# Patient Record
Sex: Female | Born: 2005 | Race: Black or African American | Hispanic: No | Marital: Single | Smoking: Never smoker
Health system: Southern US, Community
[De-identification: ages and names within clinical notes are randomized; demographics above are authoritative.]

## PROBLEM LIST (undated history)

## (undated) DIAGNOSIS — Q390 Atresia of esophagus without fistula: Secondary | ICD-10-CM

## (undated) DIAGNOSIS — J45909 Unspecified asthma, uncomplicated: Secondary | ICD-10-CM

## (undated) DIAGNOSIS — K219 Gastro-esophageal reflux disease without esophagitis: Secondary | ICD-10-CM

## (undated) DIAGNOSIS — Q675 Congenital deformity of spine: Secondary | ICD-10-CM

## (undated) DIAGNOSIS — Z8774 Personal history of (corrected) congenital malformations of heart and circulatory system: Secondary | ICD-10-CM

## (undated) HISTORY — DX: Congenital deformity of spine: Q67.5

## (undated) HISTORY — PX: TRACHEOESOPHAGEAL FISTULA REPAIR: SHX2557

---
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
## 2023-01-14 DIAGNOSIS — Z419 Encounter for procedure for purposes other than remedying health state, unspecified: Secondary | ICD-10-CM | POA: Diagnosis not present

## 2023-01-17 DIAGNOSIS — F411 Generalized anxiety disorder: Secondary | ICD-10-CM | POA: Diagnosis not present

## 2023-01-17 DIAGNOSIS — Z8659 Personal history of other mental and behavioral disorders: Secondary | ICD-10-CM | POA: Diagnosis not present

## 2023-01-17 DIAGNOSIS — F4325 Adjustment disorder with mixed disturbance of emotions and conduct: Secondary | ICD-10-CM | POA: Diagnosis not present

## 2023-02-20 DIAGNOSIS — F4325 Adjustment disorder with mixed disturbance of emotions and conduct: Secondary | ICD-10-CM | POA: Diagnosis not present

## 2023-02-20 DIAGNOSIS — F411 Generalized anxiety disorder: Secondary | ICD-10-CM | POA: Diagnosis not present

## 2023-02-20 DIAGNOSIS — Z8659 Personal history of other mental and behavioral disorders: Secondary | ICD-10-CM | POA: Diagnosis not present

## 2023-03-26 IMAGING — CR DG CHEST 2V
2 series · 2 of 2 positions shown · non-contrast
Comparison: Chest radiograph 08/22/2020

CLINICAL DATA: Cough

EXAM:
CHEST - 2 VIEW

[w chest pa]
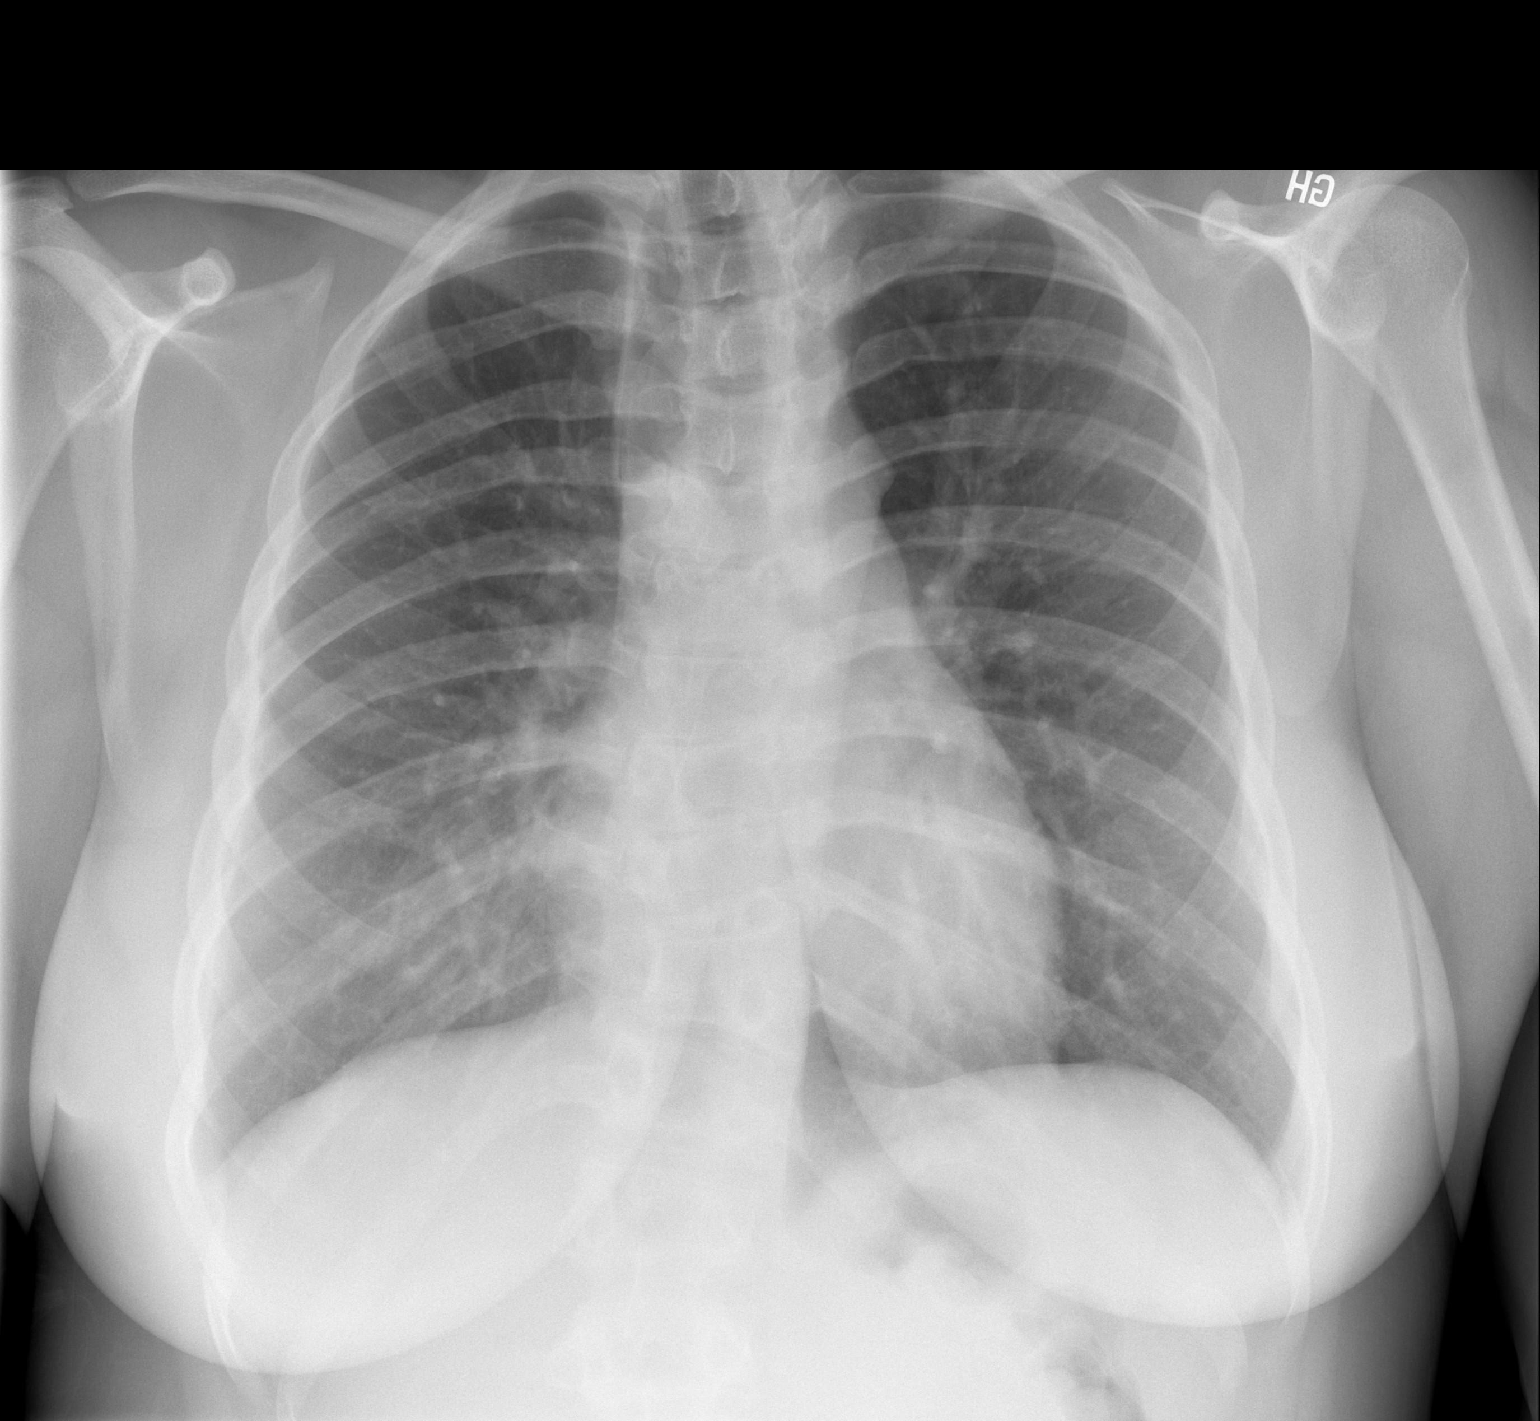

[w chest lat]
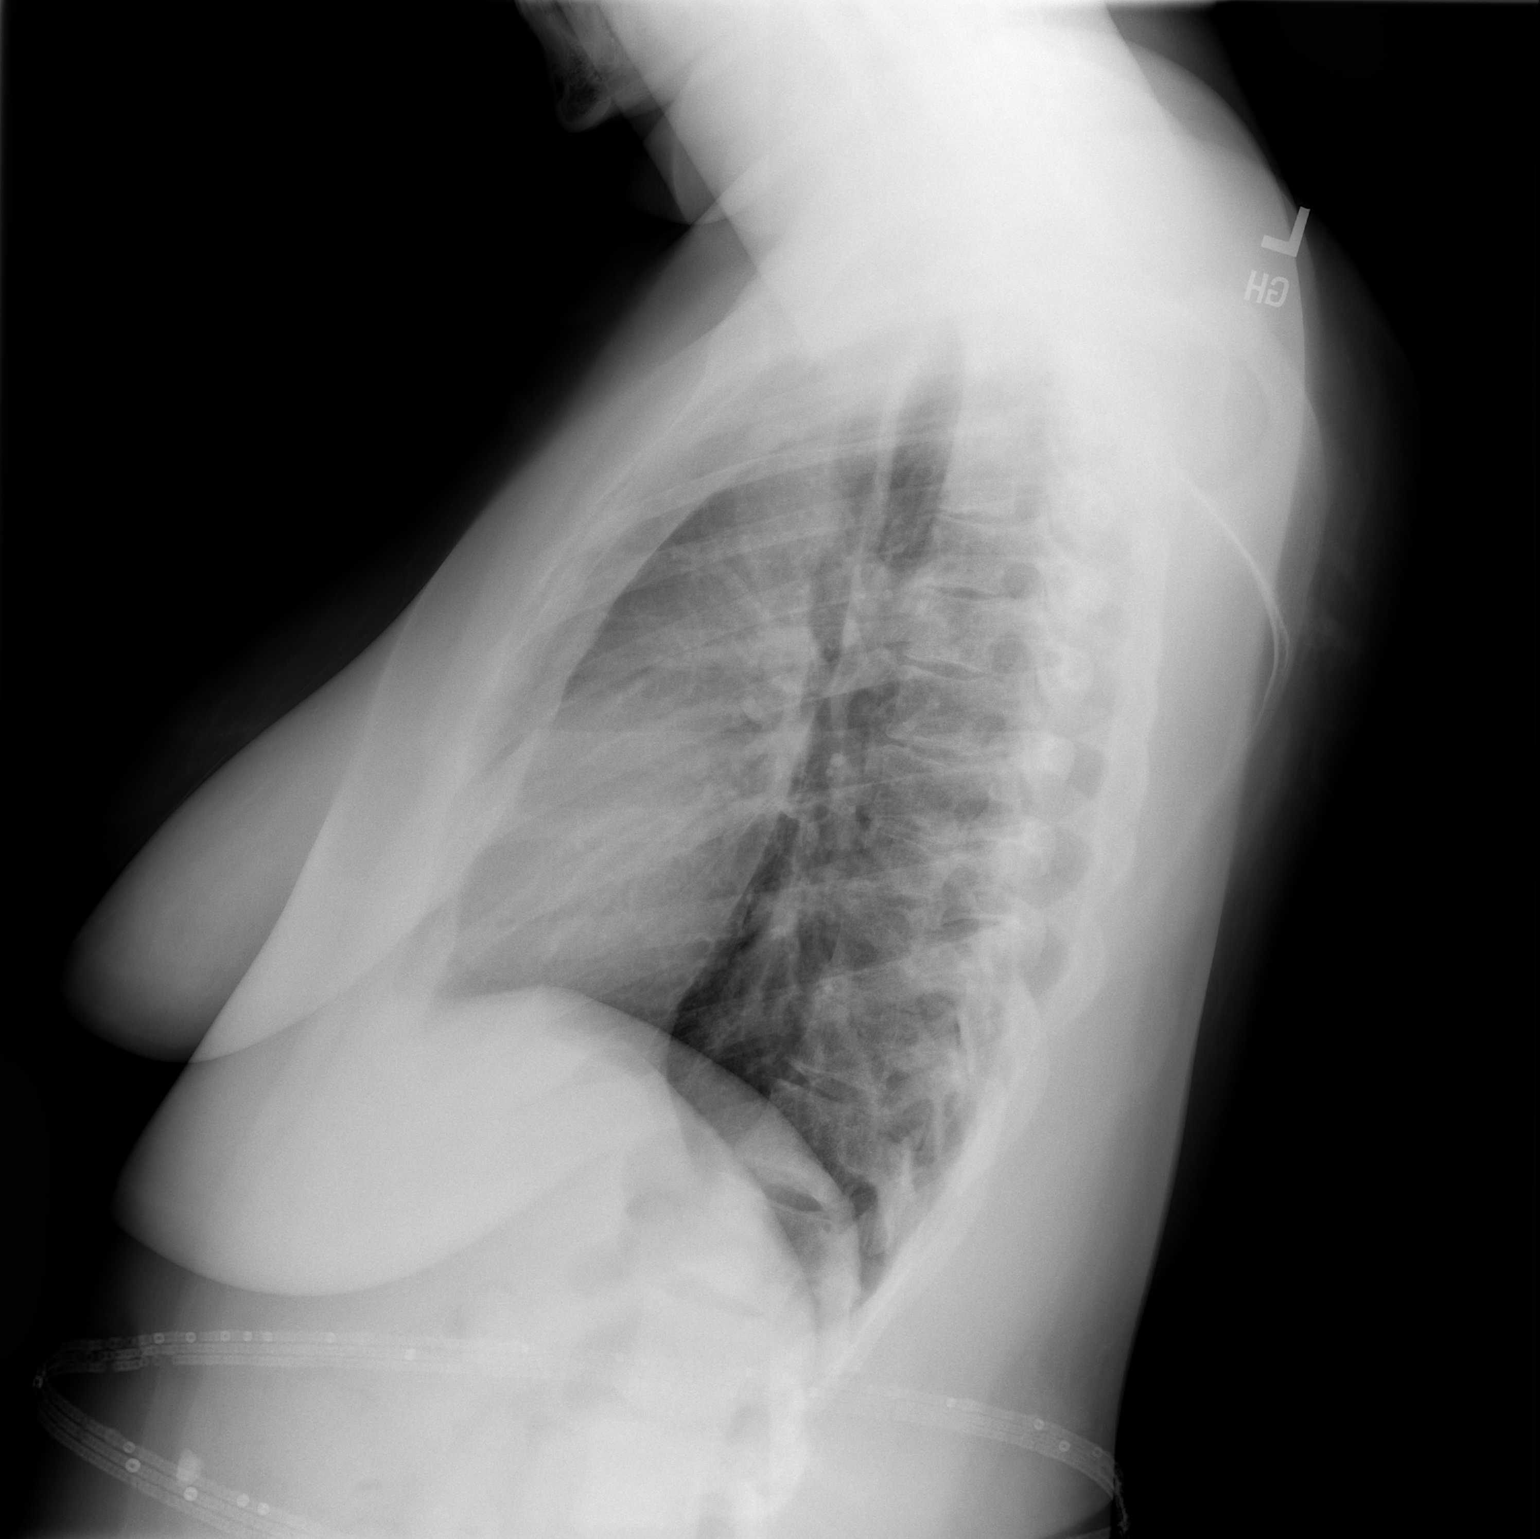

[2 of 2 positions shown; findings below may reference images not displayed]

FINDINGS: The cardiomediastinal silhouette is normal.

There is central peribronchial thickening with increased perihilar
opacities. There is no focal consolidation. There is no pulmonary
edema. There is no pleural effusion or pneumothorax.

A vertebral anomaly at T11 is again seen with associated mild
scoliosis. There is no acute osseous abnormality.
IMPRESSION: Central peribronchial thickening and perihilar opacities suggesting
viral bronchiolitis or reactive airway disease.

## 2023-05-21 ENCOUNTER — Emergency Department (HOSPITAL_COMMUNITY): Admit: 2023-05-21 | Payer: Medicaid Other | Source: Home / Self Care

## 2023-06-14 ENCOUNTER — Emergency Department (HOSPITAL_BASED_OUTPATIENT_CLINIC_OR_DEPARTMENT_OTHER)
Admission: EM | Admit: 2023-06-14 | Discharge: 2023-06-14 | Disposition: A | Discharge status: Home / Self Care | Payer: Medicaid Other | Attending: Emergency Medicine | Admitting: Emergency Medicine

## 2023-06-14 ENCOUNTER — Emergency Department (HOSPITAL_BASED_OUTPATIENT_CLINIC_OR_DEPARTMENT_OTHER): Payer: Medicaid Other

## 2023-06-14 ENCOUNTER — Encounter (HOSPITAL_BASED_OUTPATIENT_CLINIC_OR_DEPARTMENT_OTHER): Payer: Self-pay | Admitting: Emergency Medicine

## 2023-06-14 ENCOUNTER — Other Ambulatory Visit: Payer: Self-pay | Admitting: Family Medicine

## 2023-06-14 ENCOUNTER — Other Ambulatory Visit: Payer: Self-pay

## 2023-06-14 DIAGNOSIS — R197 Diarrhea, unspecified: Secondary | ICD-10-CM | POA: Insufficient documentation

## 2023-06-14 DIAGNOSIS — R110 Nausea: Secondary | ICD-10-CM | POA: Insufficient documentation

## 2023-06-14 DIAGNOSIS — J45909 Unspecified asthma, uncomplicated: Secondary | ICD-10-CM | POA: Insufficient documentation

## 2023-06-14 DIAGNOSIS — R1032 Left lower quadrant pain: Secondary | ICD-10-CM

## 2023-06-14 DIAGNOSIS — R1084 Generalized abdominal pain: Secondary | ICD-10-CM | POA: Diagnosis not present

## 2023-06-14 LAB — CBC WITH DIFFERENTIAL/PLATELET
Abs Immature Granulocytes: 0.01 10*3/uL (ref 0.00–0.07)
Basophils Absolute: 0 10*3/uL (ref 0.0–0.1)
Basophils Relative: 0 %
Eosinophils Absolute: 0.1 10*3/uL (ref 0.0–0.5)
Eosinophils Relative: 2 %
HCT: 38.3 % (ref 36.0–46.0)
Hemoglobin: 12.3 g/dL (ref 12.0–15.0)
Immature Granulocytes: 0 %
Lymphocytes Relative: 47 %
Lymphs Abs: 1.7 10*3/uL (ref 0.7–4.0)
MCH: 25.1 pg — ABNORMAL LOW (ref 26.0–34.0)
MCHC: 32.1 g/dL (ref 30.0–36.0)
MCV: 78.2 fL — ABNORMAL LOW (ref 80.0–100.0)
Monocytes Absolute: 0.3 10*3/uL (ref 0.1–1.0)
Monocytes Relative: 8 %
Neutro Abs: 1.6 10*3/uL — ABNORMAL LOW (ref 1.7–7.7)
Neutrophils Relative %: 43 %
Platelets: 318 10*3/uL (ref 150–400)
RBC: 4.9 MIL/uL (ref 3.87–5.11)
RDW: 14.5 % (ref 11.5–15.5)
WBC: 3.7 10*3/uL — ABNORMAL LOW (ref 4.0–10.5)
nRBC: 0 % (ref 0.0–0.2)

## 2023-06-14 LAB — COMPREHENSIVE METABOLIC PANEL
ALT: 10 U/L (ref 0–44)
AST: 15 U/L (ref 15–41)
Albumin: 4 g/dL (ref 3.5–5.0)
Alkaline Phosphatase: 37 U/L — ABNORMAL LOW (ref 38–126)
Anion gap: 5 (ref 5–15)
BUN: 10 mg/dL (ref 6–20)
CO2: 29 mmol/L (ref 22–32)
Calcium: 9.9 mg/dL (ref 8.9–10.3)
Chloride: 103 mmol/L (ref 98–111)
Creatinine, Ser: 0.79 mg/dL (ref 0.44–1.00)
GFR, Estimated: >60 mL/min (ref >60)
Glucose, Bld: 89 mg/dL (ref 70–99)
Potassium: 3.8 mmol/L (ref 3.5–5.1)
Sodium: 137 mmol/L (ref 135–145)
Total Bilirubin: 0.4 mg/dL (ref 0.3–1.2)
Total Protein: 7.4 g/dL (ref 6.5–8.1)

## 2023-06-14 LAB — URINALYSIS, ROUTINE W REFLEX MICROSCOPIC
Bilirubin Urine: NEGATIVE
Glucose, UA: NEGATIVE mg/dL
Hgb urine dipstick: NEGATIVE
Ketones, ur: NEGATIVE mg/dL
Leukocytes,Ua: NEGATIVE
Nitrite: NEGATIVE
Specific Gravity, Urine: 1.025 (ref 1.005–1.030)
pH: 6.5 (ref 5.0–8.0)

## 2023-06-14 LAB — LIPASE, BLOOD: Lipase: 12 U/L (ref 11–51)

## 2023-06-14 LAB — PREGNANCY, URINE: Preg Test, Ur: NEGATIVE

## 2023-06-14 MED ORDER — ONDANSETRON HCL 4 MG/2ML IJ SOLN: 4.0000 mg | Freq: Once | INTRAMUSCULAR | Status: DC | PRN

## 2023-06-14 MED ORDER — IOHEXOL 300 MG/ML  SOLN
100.0000 mL | Freq: Once | INTRAMUSCULAR | Status: AC | PRN
  Administered 2023-06-14: 85 mL via INTRAVENOUS

## 2023-06-14 MED ORDER — DICYCLOMINE HCL 20 MG PO TABS
20.0000 mg | ORAL_TABLET | Freq: Two times a day (BID) | ORAL | 0 refills | Status: AC
Start: 2023-06-14 — End: ?

## 2023-06-14 NOTE — Discharge Instructions (Signed)
Thank you for allowing [place] to be a part of your care today.  Your evaluated in the ED for abdominal pain, nausea, and diarrhea.  I suspect your symptoms are related to a virus that we will need to run its course.  Your workup today is overall reassuring and your CT scan was negative for any abnormal findings.  I have sent over a medication to the pharmacy called Bentyl.  This is an antispasmodic that will hopefully help with the cramping you experience in your abdomen.  I also encourage you to pick up your Zofran to take as needed for nausea.  I recommend slowly advancing your diet as tolerated.  You may want to stick to liquids or soft solids, such as bland food, until your symptoms begin to improve.  Return to the ED if you develop sudden worsening of your symptoms or if you have new concerns.

## 2023-06-14 NOTE — ED Triage Notes (Signed)
Abdo pain Started last Tuesday.LUQ Some nausea. Loss of appetite

## 2023-06-14 NOTE — ED Provider Notes (Signed)
[place] [facility]ERGENCY DEPARTMENT AT [facility] Provider Note   CSN: [id number] Arrival date & time: 06/14/23  1054     History  Chief Complaint  Patient presents with   Abdominal Pain    Susan Harmon is a 17 y.o. female with past medical history significant for asthma, esophageal atresia, reflux presents to the ED complaining of abdominal pain that began last Tuesday.  She reports that initially started in her lower abdomen but is now on the left side.  She has also had a loss of her appetite, nausea, and diarrhea.  Patient has been evaluated [facility] and was given Zofran, but she has not yet filled this.  She states that she was also tested for STIs which were all negative.  She describes the pain as a cramping.  Her LMP was 05/29/2023 and was lighter than her normal.  Denies vomiting, constipation, fever, urinary symptoms, vaginal discharge or bleeding, pelvic pain.       Home Medications Prior to Admission medications   Medication Sig Start Date End Date Taking? Authorizing Provider  dicyclomine (BENTYL) 20 MG tablet Take 1 tablet (20 mg total) by mouth 2 (two) times daily. 06/14/23  Yes Ailen Strauch R, PA-C  albuterol (PROVENTIL HFA;VENTOLIN HFA) 108 (90 BASE) MCG/ACT inhaler Inhale 1-2 puffs into the lungs every 6 (six) hours as needed for wheezing or shortness of breath.    [provider]  cetirizine (ZYRTEC) 10 MG tablet Take by mouth. 01/07/21   [provider]  fluticasone (FLONASE) 50 MCG/ACT nasal spray Place into both nostrils. 07/12/22   [provider]  ibuprofen (ADVIL,MOTRIN) 400 MG tablet Take 1 tablet (400 mg total) by mouth every 6 (six) hours as needed. 11/13/17   Antony Madura, PA-C  ibuprofen (ADVIL,MOTRIN) 600 MG tablet Take 1 tablet (600 mg total) by mouth every 6 (six) hours as needed for fever, headache or mild pain. 01/28/18   Sherrilee Gilles, NP  ipratropium (ATROVENT) 0.02 % nebulizer  solution Take 2.5 mLs (0.5 mg total) by nebulization every 6 (six) hours as needed for wheezing or shortness of breath. 07/07/22   Renne Crigler, PA-C  montelukast (SINGULAIR) 10 MG tablet Take 1 tablet by mouth daily. 05/17/21   [provider]  Pantoprazole Sodium (PROTONIX PO) Take by mouth.    [provider]  SYMBICORT 160-4.5 MCG/ACT inhaler Inhale 2 puffs into the lungs 2 (two) times daily. 07/19/22   [provider]      Allergies    Patient has no known allergies.    Review of Systems   Review of Systems  Constitutional:  Positive for appetite change (decreased). Negative for fever.  Gastrointestinal:  Positive for abdominal pain, diarrhea and nausea. Negative for constipation and vomiting.  Genitourinary:  Negative for dysuria, frequency, pelvic pain, urgency, vaginal bleeding and vaginal discharge.    Physical Exam Updated Vital Signs BP 114/70   Pulse 92   Temp 98.3 F (36.8 C) (Oral)   Resp 16   LMP 05/29/2023   SpO2 99%  Physical Exam Vitals and nursing note reviewed.  Constitutional:      General: She is not in acute distress.    Appearance: Normal appearance. She is not ill-appearing or diaphoretic.  Cardiovascular:     Rate and Rhythm: Normal rate and regular rhythm.  Pulmonary:     Effort: Pulmonary effort is normal.  Abdominal:     General: Abdomen is flat.  Palpations: Abdomen is soft.     Tenderness: There is abdominal tenderness in the left upper quadrant and left lower quadrant.  Skin:    General: Skin is warm and dry.     Capillary Refill: Capillary refill takes less than 2 seconds.  Neurological:     Mental Status: She is alert. Mental status is at baseline.  Psychiatric:        Mood and Affect: Mood normal.        Behavior: Behavior normal.     ED Results / Procedures / Treatments   Labs (all labs ordered are listed, but only abnormal results are displayed) Labs Reviewed  COMPREHENSIVE METABOLIC PANEL -  Abnormal; Notable for the following components:      Result Value   Alkaline Phosphatase 37 (*)    All other components within normal limits  URINALYSIS, ROUTINE W REFLEX MICROSCOPIC - Abnormal; Notable for the following components:   Protein, ur TRACE (*)    All other components within normal limits  CBC WITH DIFFERENTIAL/PLATELET - Abnormal; Notable for the following components:   WBC 3.7 (*)    MCV 78.2 (*)    MCH 25.1 (*)    Neutro Abs 1.6 (*)    All other components within normal limits  LIPASE, BLOOD  PREGNANCY, URINE    EKG None  Radiology CT ABDOMEN PELVIS W CONTRAST  Result Date: 06/14/2023 CLINICAL DATA:  Left upper quadrant pain for several days. Nausea. Anorexia. EXAM: CT ABDOMEN AND PELVIS WITH CONTRAST TECHNIQUE: Multidetector CT imaging of the abdomen and pelvis was performed using the standard protocol following bolus administration of intravenous contrast. RADIATION DOSE REDUCTION: This exam was performed according to the departmental dose-optimization program which includes automated exposure control, adjustment of the mA and/or kV according to patient size and/or use of iterative reconstruction technique. CONTRAST:  85mL OMNIPAQUE IOHEXOL 300 MG/ML  SOLN COMPARISON:  None Available. FINDINGS: Lower Chest: No acute findings. Hepatobiliary: No suspicious hepatic masses identified. Gallbladder is unremarkable. No evidence of biliary ductal dilatation. Pancreas:  No mass or inflammatory changes. Spleen: Within normal limits in size and appearance. Adrenals/Urinary Tract: No suspicious masses identified. No evidence of ureteral calculi or hydronephrosis. Stomach/Bowel: No evidence of obstruction, inflammatory process or abnormal fluid collections. Normal appendix visualized. Vascular/Lymphatic: No pathologically enlarged lymph nodes. No acute vascular findings. Reproductive:  No mass or other significant abnormality. Other:  None. Musculoskeletal:  No suspicious bone lesions  identified. IMPRESSION: Negative.  No acute findings or other significant abnormality. Electronically Signed   By: Danae Orleans M.D.   On: 06/14/2023 14:48    Procedures Procedures    Medications Ordered in ED Medications  ondansetron (ZOFRAN) injection 4 mg (has no administration in time range)  iohexol (OMNIPAQUE) 300 MG/ML solution 100 mL (85 mLs Intravenous Contrast Given 06/14/23 1222)    ED Course/ Medical Decision Making/ A&P                                 Medical Decision Making Amount and/or Complexity of Data Reviewed Labs: ordered. Radiology: ordered.  Risk Prescription drug management.   This patient presents to the ED with chief complaint(s) of abdominal pain, nausea, diarrhea with noncontributory past medical history.  The complaint involves an extensive differential diagnosis and also carries with it a high risk of complications and morbidity.    The differential diagnosis includes viral syndrome, gastroenteritis, colitis, diverticulitis, UTI  The initial plan  is to obtain labs, UA, CT  Initial Assessment:   Exam significant for well-appearing patient who is not in acute distress.  Abdomen is soft and mildly tender to the left side.  No RUQ or RLQ tenderness.  No point tenderness.  No abdominal distention, overlying skin changes, or palpable hernias.  Vital signs are stable.  Patient is afebrile.  She is not actively vomiting.  Independent ECG/labs interpretation:  The following labs were independently interpreted:  CBC with mild leukocytosis, no anemia.  Metabolic panel without major electrolyte disturbance.  Hepatic and renal function are relatively normal.  UA without evidence of infection.  Pregnancy negative.  Lipase also not indicative of pancreatitis.  Independent visualization and interpretation of imaging: I independently visualized the following imaging with scope of interpretation limited to determining acute life threatening conditions related to  emergency care: CT abdomen pelvis, which revealed no acute intra-abdominal pathology to explain patient's symptoms.  Treatment and Reassessment: Patient was able to tolerate drinking without need of antiemetics.  Disposition:   Patient has prescription for Zofran prescribed by [facility] at [facility].  Will send her home on Bentyl to help with abdominal pain and cramping.  Suspect patient's symptoms are related to a viral syndrome.  Her workup today is overall reassuring.  The patient has been appropriately medically screened and/or stabilized in the ED. I have low suspicion for any other emergent medical condition which would require further screening, evaluation or treatment in the ED or require inpatient management. At time of discharge the patient is hemodynamically stable and in no acute distress. I have discussed work-up results and diagnosis with patient and answered all questions. Patient is agreeable with discharge plan. We discussed strict return precautions for returning to the emergency department and they verbalized understanding.              Final Clinical Impression(s) / ED Diagnoses Final diagnoses:  Generalized abdominal pain  Nausea without vomiting  Diarrhea, unspecified type    Rx / DC Orders ED Discharge Orders          Ordered    dicyclomine (BENTYL) 20 MG tablet  2 times daily        06/14/23 [address] PA-C 06/14/23 1508    Gwyneth Sprout, MD 06/16/23 [phone]

## 2023-06-15 ENCOUNTER — Ambulatory Visit
Admission: RE | Admit: 2023-06-15 | Discharge: 2023-06-15 | Disposition: A | Discharge status: Home / Self Care | Payer: Medicaid Other | Source: Ambulatory Visit | Attending: Family Medicine | Admitting: Family Medicine

## 2023-06-15 DIAGNOSIS — R1032 Left lower quadrant pain: Secondary | ICD-10-CM

## 2023-06-16 ENCOUNTER — Telehealth: Payer: Self-pay
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
## 2023-10-04 IMAGING — CR DG CHEST 2V
2 series · 2 of 2 positions shown · non-contrast
Comparison: July 04, 2021.

CLINICAL DATA: Fever, cough.

EXAM:
CHEST - 2 VIEW

[chest lat]
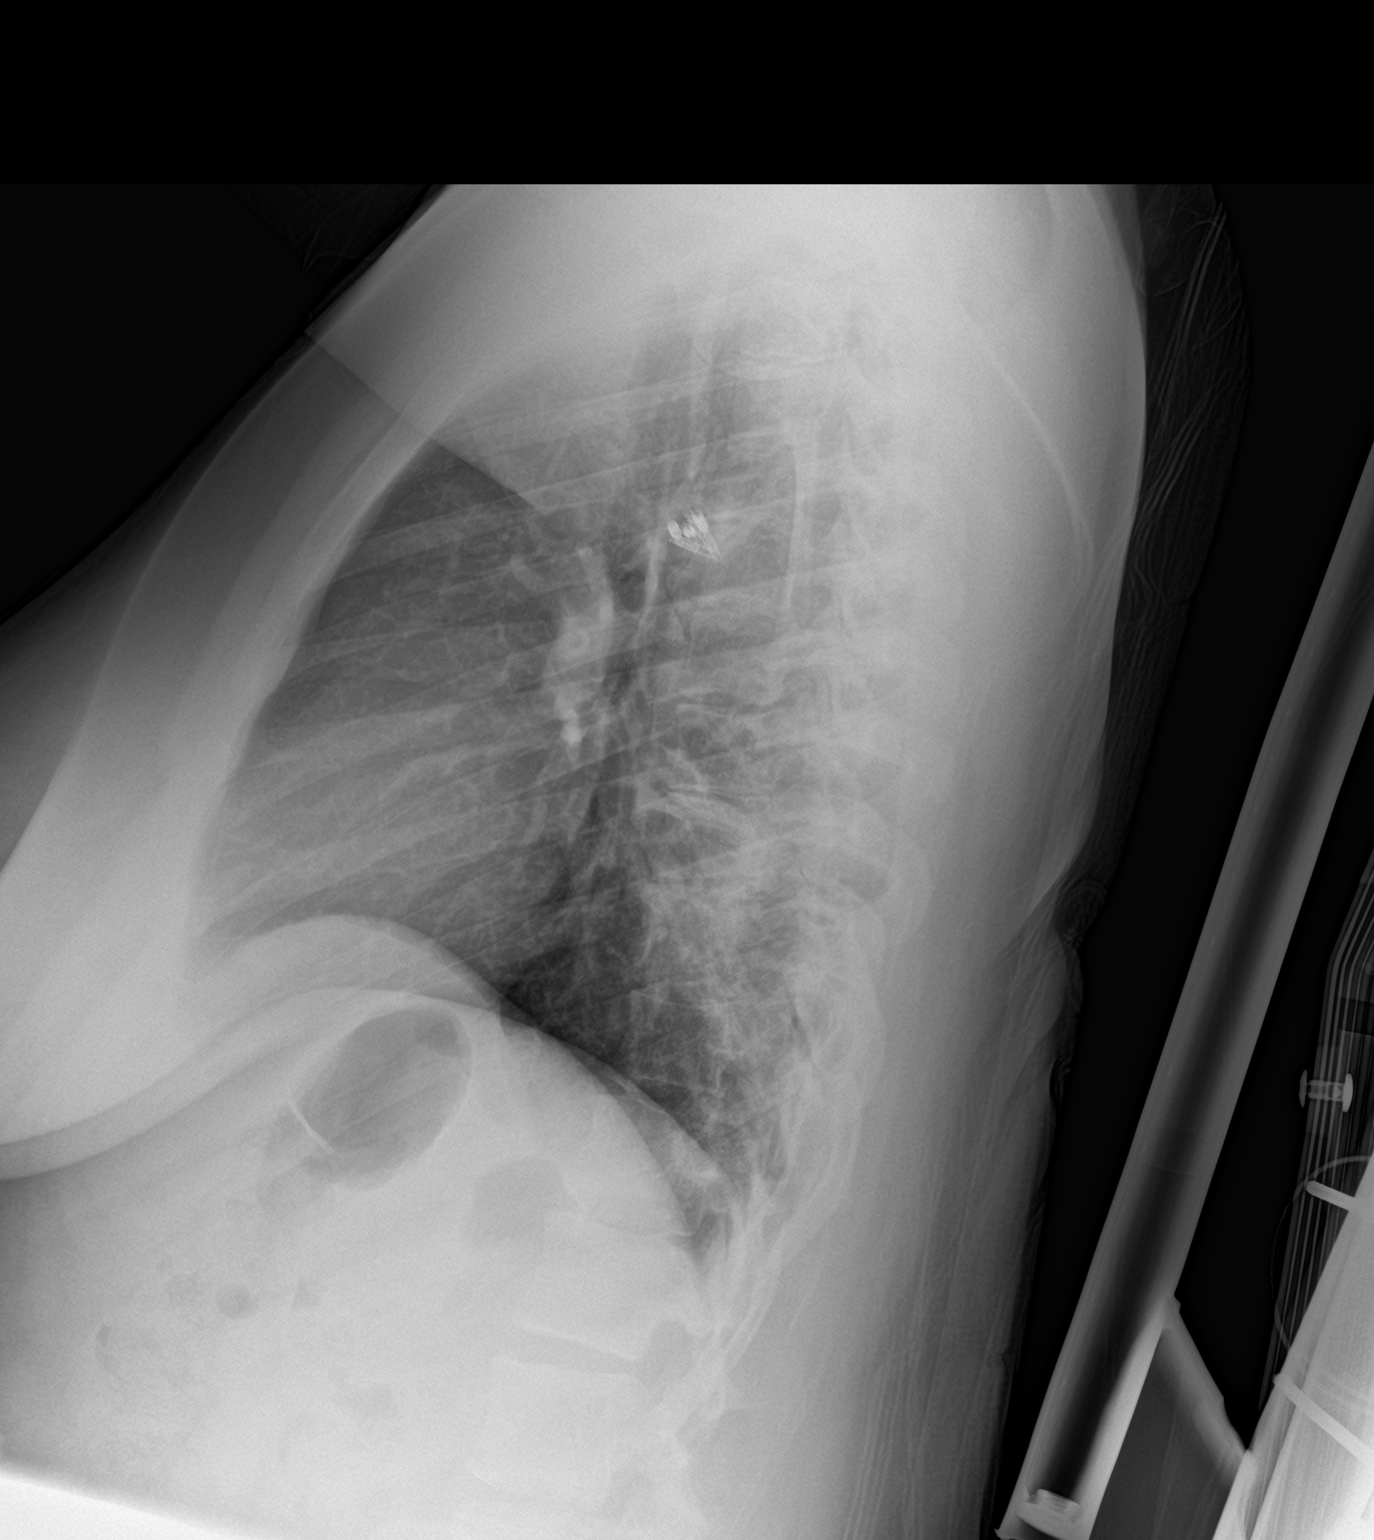

[chest ap]
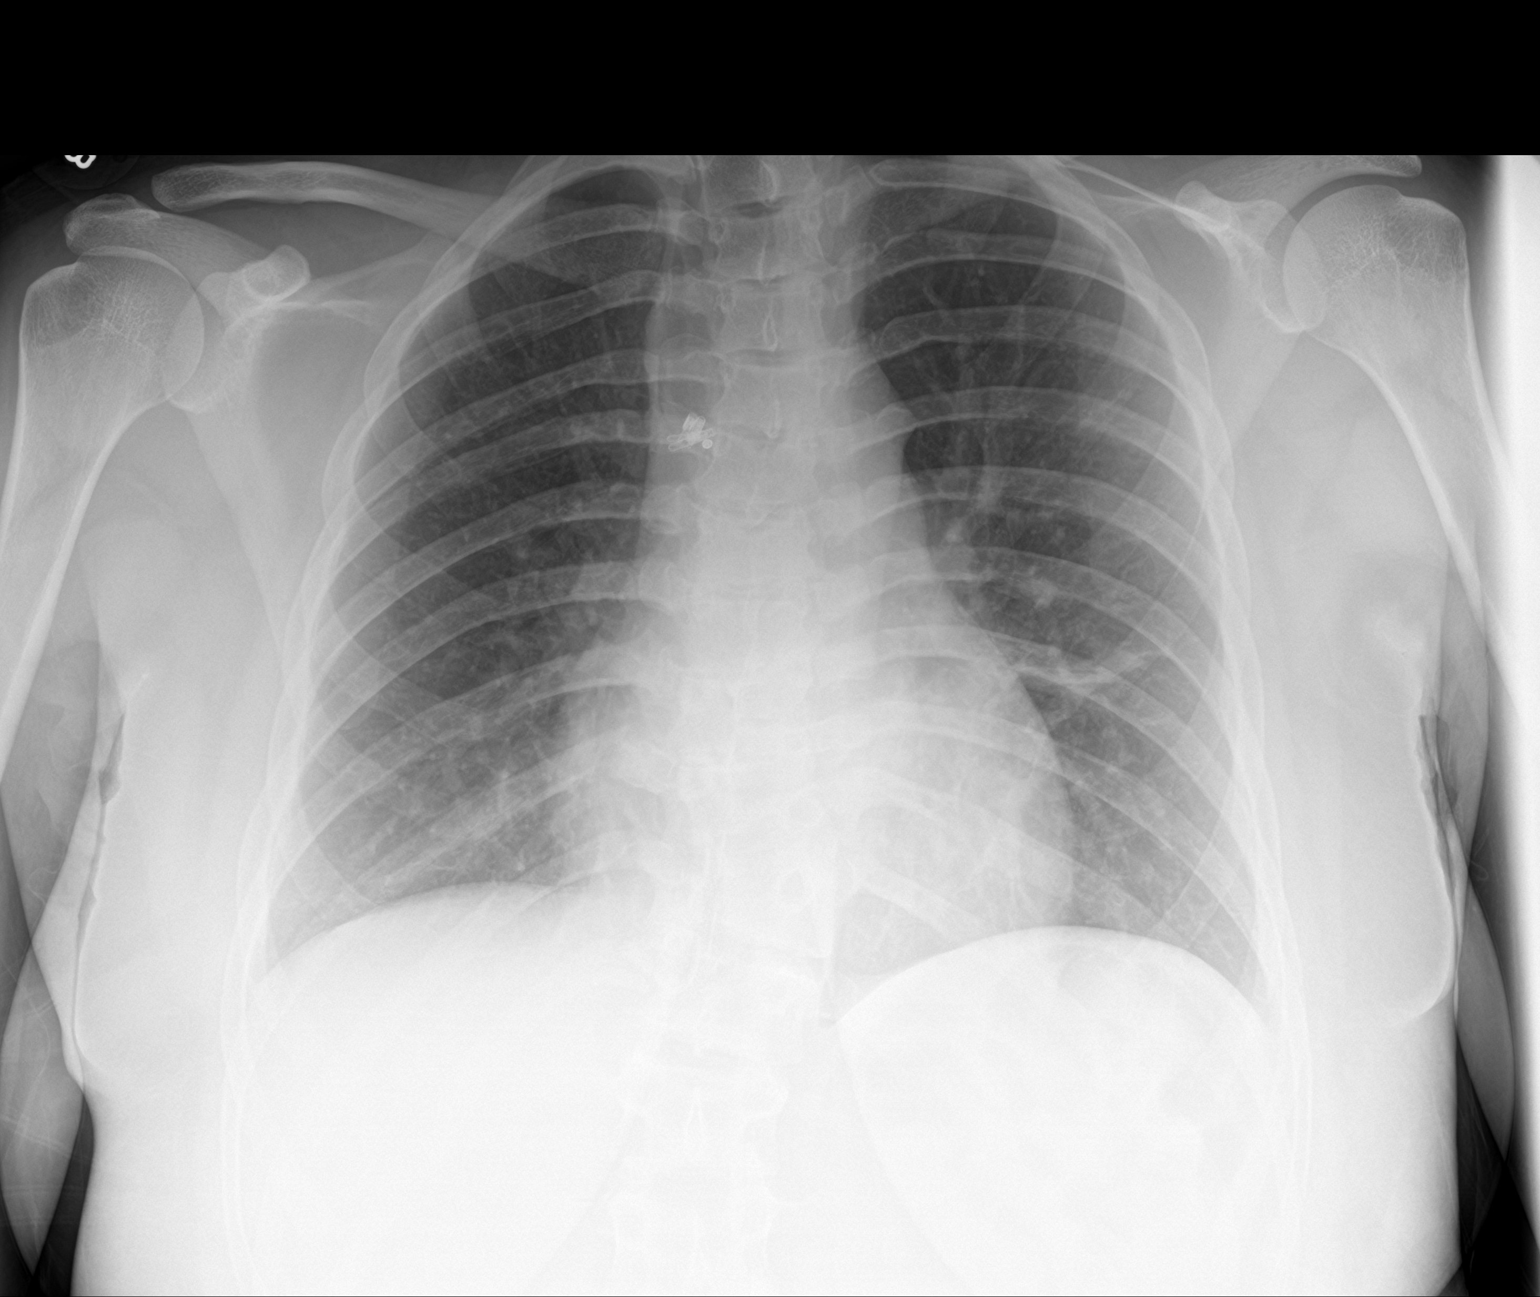

[2 of 2 positions shown; findings below may reference images not displayed]

FINDINGS: The heart size and mediastinal contours are within normal limits.
Right lung is clear. Left lingular subsegmental atelectasis is
noted. The visualized skeletal structures are unremarkable.
IMPRESSION: Mild left lingular subsegmental atelectasis.

## 2023-10-10 ENCOUNTER — Encounter (HOSPITAL_BASED_OUTPATIENT_CLINIC_OR_DEPARTMENT_OTHER): Payer: Self-pay | Admitting: Emergency Medicine

## 2023-10-10 ENCOUNTER — Emergency Department (HOSPITAL_BASED_OUTPATIENT_CLINIC_OR_DEPARTMENT_OTHER)
Admission: EM | Admit: 2023-10-10 | Discharge: 2023-10-10 | Disposition: A | Discharge status: Home / Self Care | Payer: No Typology Code available for payment source | Attending: Emergency Medicine | Admitting: Emergency Medicine

## 2023-10-10 DIAGNOSIS — M25562 Pain in left knee: Secondary | ICD-10-CM | POA: Insufficient documentation

## 2023-10-10 DIAGNOSIS — M545 Low back pain, unspecified: Secondary | ICD-10-CM | POA: Diagnosis present

## 2023-10-10 DIAGNOSIS — Y92410 Unspecified street and highway as the place of occurrence of the external cause: Secondary | ICD-10-CM | POA: Insufficient documentation

## 2023-10-10 DIAGNOSIS — Z79899 Other long term (current) drug therapy: Secondary | ICD-10-CM | POA: Diagnosis not present

## 2023-10-10 MED ORDER — METHOCARBAMOL 500 MG PO TABS
500.0000 mg | ORAL_TABLET | Freq: Two times a day (BID) | ORAL | 0 refills | Status: AC
Start: 2023-10-10 — End: ?
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
## 2023-10-10 NOTE — ED Notes (Signed)
 Reviewed AVS/discharge instruction with patient. Time allotted for and all questions answered. Patient is agreeable for d/c and escorted to ed exit by staff.

## 2023-12-09 ENCOUNTER — Emergency Department (HOSPITAL_BASED_OUTPATIENT_CLINIC_OR_DEPARTMENT_OTHER)

## 2023-12-09 ENCOUNTER — Other Ambulatory Visit: Payer: Self-pay

## 2023-12-09 ENCOUNTER — Emergency Department (HOSPITAL_BASED_OUTPATIENT_CLINIC_OR_DEPARTMENT_OTHER)
Admission: EM | Admit: 2023-12-09 | Discharge: 2023-12-09 | Disposition: A | Discharge status: Home / Self Care | Attending: Emergency Medicine | Admitting: Emergency Medicine

## 2023-12-09 ENCOUNTER — Encounter (HOSPITAL_BASED_OUTPATIENT_CLINIC_OR_DEPARTMENT_OTHER): Payer: Self-pay | Admitting: Emergency Medicine

## 2023-12-09 DIAGNOSIS — R059 Cough, unspecified: Secondary | ICD-10-CM | POA: Diagnosis present

## 2023-12-09 DIAGNOSIS — Z7952 Long term (current) use of systemic steroids: Secondary | ICD-10-CM | POA: Diagnosis not present

## 2023-12-09 DIAGNOSIS — J4541 Moderate persistent asthma with (acute) exacerbation: Secondary | ICD-10-CM | POA: Diagnosis not present

## 2023-12-09 DIAGNOSIS — Z7951 Long term (current) use of inhaled steroids: Secondary | ICD-10-CM | POA: Insufficient documentation

## 2023-12-09 LAB — RESP PANEL BY RT-PCR (RSV, FLU A&B, COVID)  RVPGX2
Influenza A by PCR: NEGATIVE
Influenza B by PCR: NEGATIVE
Resp Syncytial Virus by PCR: NEGATIVE
SARS Coronavirus 2 by RT PCR: NEGATIVE

## 2023-12-09 MED ORDER — ALBUTEROL SULFATE HFA 108 (90 BASE) MCG/ACT IN AERS
2.0000 | INHALATION_SPRAY | RESPIRATORY_TRACT | Status: DC | PRN
Start: 2023-12-09 — End: 2023-12-09

## 2023-12-09 MED ORDER — PREDNISONE 20 MG PO TABS: 40.0000 mg | ORAL_TABLET | Freq: Every day | ORAL | 0 refills | Status: AC

## 2023-12-09 MED ORDER — PREDNISONE 50 MG PO TABS
60.0000 mg | ORAL_TABLET | Freq: Once | ORAL | Status: AC
  Administered 2023-12-09: 60 mg via ORAL
  Filled 2023-12-09: qty 1

## 2023-12-09 MED ORDER — ALBUTEROL SULFATE (2.5 MG/3ML) 0.083% IN NEBU
2.5000 mg | INHALATION_SOLUTION | Freq: Once | RESPIRATORY_TRACT | Status: AC
  Administered 2023-12-09: 2.5 mg via RESPIRATORY_TRACT
  Filled 2023-12-09: qty 3
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
## 2023-12-09 NOTE — ED Notes (Signed)
 RT Note: Patient has HX Asthma. Breath sounds are slight expiratory wheezes with a tight chest feeling. A breathing treatment is ordered

## 2023-12-09 NOTE — ED Notes (Signed)
 RT note: Patient was asked if she needed an Albuterol  inhaler before she left, she stated she is good and doesn't need one at this time

## 2023-12-09 NOTE — ED Provider Notes (Signed)
 [place] EMERGENCY DEPARTMENT AT [facility] Provider Note   CSN: [id number] Arrival date & time: 12/09/23  0707     History  Chief Complaint  Patient presents with   asthma exacerbation    Susan Harmon is a 18 y.o. female.  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  She reports over the last few days she is felt cold with nonproductive cough but this morning is when she woke up with the shortness of breath.  She has had nasal congestion and a bit of a sore throat.  She has had multiple sick contacts that she lives in a college dorm but also reports that this morning she realized her air conditioning unit had significant mold.  She has tried to use her inhaler at home today but it did not make her feel better.  The history is provided by the patient.       Home Medications Prior to Admission medications   Medication Sig Start Date End Date Taking? Authorizing Provider  predniSONE  (DELTASONE ) 20 MG tablet Take 2 tablets (40 mg total) by mouth daily. 12/09/23  Yes Almond Army, MD  albuterol  (PROVENTIL  HFA;VENTOLIN  HFA) 108 (90 BASE) MCG/ACT inhaler Inhale 1-2 puffs into the lungs every 6 (six) hours as needed for wheezing or shortness of breath.    [provider]  cetirizine (ZYRTEC) 10 MG tablet Take by mouth. 01/07/21   [provider]  dicyclomine  (BENTYL ) 20 MG tablet Take 1 tablet (20 mg total) by mouth 2 (two) times daily. 06/14/23   Clark, Meghan R, PA-C  fluticasone (FLONASE) 50 MCG/ACT nasal spray Place into both nostrils. 07/12/22   [provider]  ibuprofen  (ADVIL ,MOTRIN ) 400 MG tablet Take 1 tablet (400 mg total) by mouth every 6 (six) hours as needed. 11/13/17   Carleton Cheek, PA-C  ibuprofen  (ADVIL ,MOTRIN ) 600 MG tablet Take 1  tablet (600 mg total) by mouth every 6 (six) hours as needed for fever, headache or mild pain. 01/28/18   Jannine Meo, NP  ipratropium (ATROVENT ) 0.02 % nebulizer solution Take 2.5 mLs (0.5 mg total) by nebulization every 6 (six) hours as needed for wheezing or shortness of breath. 07/07/22   Geiple, Joshua, PA-C  methocarbamol  (ROBAXIN ) 500 MG tablet Take 1 tablet (500 mg total) by mouth 2 (two) times daily. 10/10/23   Felicie Horning, PA-C  montelukast (SINGULAIR) 10 MG tablet Take 1 tablet by mouth daily. 05/17/21   [provider]  Pantoprazole Sodium (PROTONIX PO) Take by mouth.    [provider]  SYMBICORT 160-4.5 MCG/ACT inhaler Inhale 2 puffs into the lungs 2 (two) times daily. 07/19/22   [provider]      Allergies    Patient has no known allergies.    Review of Systems   Review of Systems  Physical Exam Updated Vital Signs BP 104/78   Pulse 75   Temp 98 F (36.7 C) (Oral)   Resp 17   Wt 89 kg   SpO2 99%  Physical Exam Vitals and nursing note reviewed.  Constitutional:      General: She is not in acute distress.    Appearance: She is well-developed.  HENT:     Head: Normocephalic and atraumatic.     Nose: Congestion present.     Right Turbinates: Swollen.  Left Turbinates: Swollen.     Mouth/Throat:     Mouth: Mucous membranes are moist.     Pharynx: Posterior oropharyngeal erythema present. No oropharyngeal exudate.  Eyes:     Pupils: Pupils are equal, round, and reactive to light.  Cardiovascular:     Rate and Rhythm: Normal rate and regular rhythm.     Heart sounds: Normal heart sounds. No murmur heard.    No friction rub.  Pulmonary:     Effort: Pulmonary effort is normal.     Breath sounds: Normal breath sounds. No wheezing or rales.     Comments: Wheezing per RT however upon my arrival patient had completed a DuoNeb and now has no wheezing Musculoskeletal:        General: No tenderness. Normal range of motion.      Comments: No edema  Skin:    General: Skin is warm and dry.     Findings: No rash.  Neurological:     Mental Status: She is alert and oriented to person, place, and time. Mental status is at baseline.  Psychiatric:        Behavior: Behavior normal.     ED Results / Procedures / Treatments   Labs (all labs ordered are listed, but only abnormal results are displayed) Labs Reviewed  RESP PANEL BY RT-PCR (RSV, FLU A&B, COVID)  [id number]    EKG None  Radiology DG Chest Port 1 View Result Date: 12/09/2023 CLINICAL DATA:  Cough and shortness of breath. EXAM: PORTABLE CHEST 1 VIEW COMPARISON:  07/07/2022 FINDINGS: Normal heart size and stable mediastinal contours, including upper esophageal gas distension. No acute infiltrate or edema. No effusion or pneumothorax. No acute osseous findings. Closely position fifth and sixth ribs on the right, likely from prior thoracotomy in this patient with history of TE fistula repair IMPRESSION: Stable postoperative chest.  No acute finding Electronically Signed   By: Ronnette Coke M.D.   On: 12/09/2023 08:03    Procedures Procedures    Medications Ordered in ED Medications  albuterol  (VENTOLIN  HFA) 108 (90 Base) MCG/ACT inhaler 2 puff (has no administration in time range)  albuterol  (PROVENTIL ) (2.5 MG/3ML) 0.083% nebulizer solution 2.5 mg (2.5 mg Nebulization Given 12/09/23 0725)  predniSONE  (DELTASONE ) tablet 60 mg (60 mg Oral Given 12/09/23 0749)    ED Course/ Medical Decision Making/ A&P                                 Medical Decision Making Amount and/or Complexity of Data Reviewed Radiology: ordered and independent interpretation performed. Decision-making details documented in ED Course.  Risk Prescription drug management.   Pt with multiple medical problems and comorbidities and presenting today with a complaint that caries a high risk for morbidity and mortality.  Here today with URI symptoms and back causing exacerbation.  Also  patient exposed to mold through her air conditioning unit.  After 1 DuoNeb wheezing has resolved.  Oxygen saturation is 100%.  Will swab for COVID as well as patient given a dose of prednisone .  I have independently visualized and interpreted pt's images today.  Chest x-ray without acute findings today.  Viral swabs are negative.  On repeat evaluation patient wheezing has still resolved.  She is satting 99% on room air and at this time appears stable for discharge home.  She will be given a short course of prednisone .  Final Clinical Impression(s) / ED Diagnoses Final diagnoses:  Moderate persistent asthma with exacerbation    Rx / DC Orders ED Discharge Orders          Ordered    predniSONE  (DELTASONE ) 20 MG tablet  Daily        12/09/23 0851              Almond Army, MD 12/09/23 [phone]

## 2023-12-09 NOTE — Discharge Instructions (Addendum)
 The x-ray was normal today and the COVID test was negative.  Try to avoid the dorm room until they are able to fix the mold issue.

## 2023-12-22 ENCOUNTER — Emergency Department (HOSPITAL_BASED_OUTPATIENT_CLINIC_OR_DEPARTMENT_OTHER)
Admission: EM | Admit: 2023-12-22 | Discharge: 2023-12-22 | Disposition: A | Discharge status: Home / Self Care | Attending: Emergency Medicine | Admitting: Emergency Medicine

## 2023-12-22 ENCOUNTER — Encounter (HOSPITAL_BASED_OUTPATIENT_CLINIC_OR_DEPARTMENT_OTHER): Payer: Self-pay | Admitting: Emergency Medicine

## 2023-12-22 ENCOUNTER — Other Ambulatory Visit: Payer: Self-pay

## 2023-12-22 DIAGNOSIS — N939 Abnormal uterine and vaginal bleeding, unspecified: Secondary | ICD-10-CM | POA: Insufficient documentation

## 2023-12-22 DIAGNOSIS — R1032 Left lower quadrant pain: Secondary | ICD-10-CM | POA: Diagnosis present

## 2023-12-22 LAB — WET PREP, GENITAL
Clue Cells Wet Prep HPF POC: NONE SEEN
Sperm: NONE SEEN
Trich, Wet Prep: NONE SEEN
WBC, Wet Prep HPF POC: <10 (ref <10)
Yeast Wet Prep HPF POC: NONE SEEN

## 2023-12-22 LAB — URINALYSIS, ROUTINE W REFLEX MICROSCOPIC
Bilirubin Urine: NEGATIVE
Glucose, UA: NEGATIVE mg/dL
Ketones, ur: NEGATIVE mg/dL
Nitrite: NEGATIVE
Protein, ur: NEGATIVE mg/dL
RBC / HPF: >50 RBC/hpf (ref 0–5)
Specific Gravity, Urine: 1.007 (ref 1.005–1.030)
pH: 7 (ref 5.0–8.0)

## 2023-12-22 LAB — CBC
HCT: 38.3 % (ref 36.0–46.0)
Hemoglobin: 12.4 g/dL (ref 12.0–15.0)
MCH: 25.1 pg — ABNORMAL LOW (ref 26.0–34.0)
MCHC: 32.4 g/dL (ref 30.0–36.0)
MCV: 77.5 fL — ABNORMAL LOW (ref 80.0–100.0)
Platelets: 305 10*3/uL (ref 150–400)
RBC: 4.94 MIL/uL (ref 3.87–5.11)
RDW: 14.4 % (ref 11.5–15.5)
WBC: 5.1 10*3/uL (ref 4.0–10.5)
nRBC: 0 % (ref 0.0–0.2)

## 2023-12-22 LAB — COMPREHENSIVE METABOLIC PANEL WITH GFR
ALT: 11 U/L (ref 0–44)
AST: 15 U/L (ref 15–41)
Albumin: 4.1 g/dL (ref 3.5–5.0)
Alkaline Phosphatase: 51 U/L (ref 38–126)
Anion gap: 9 (ref 5–15)
BUN: 7 mg/dL (ref 6–20)
CO2: 27 mmol/L (ref 22–32)
Calcium: 9.5 mg/dL (ref 8.9–10.3)
Chloride: 99 mmol/L (ref 98–111)
Creatinine, Ser: 0.71 mg/dL (ref 0.44–1.00)
GFR, Estimated: >60 mL/min (ref >60)
Glucose, Bld: 113 mg/dL — ABNORMAL HIGH (ref 70–99)
Potassium: 4.2 mmol/L (ref 3.5–5.1)
Sodium: 135 mmol/L (ref 135–145)
Total Bilirubin: <0.2 mg/dL (ref 0.0–1.2)
Total Protein: 7.3 g/dL (ref 6.5–8.1)

## 2023-12-22 LAB — LIPASE, BLOOD: Lipase: 15 U/L (ref 11–51)

## 2023-12-22 LAB — PREGNANCY, URINE: Preg Test, Ur: NEGATIVE

## 2023-12-22 MED ORDER — MEDROXYPROGESTERONE ACETATE 5 MG PO TABS
10.0000 mg | ORAL_TABLET | Freq: Three times a day (TID) | ORAL | 0 refills | Status: AC
Start: 2023-12-22 — End: 2023-12-27

## 2023-12-22 NOTE — ED Notes (Signed)
 Patient unable to provide urine sample at this time. Specimen cup given to patient and reviewed instructions for clean catch collection.

## 2023-12-22 NOTE — ED Triage Notes (Signed)
 LLQ pain, vaginal bleeding Started today around 2:45pm  Seen at [facility]  UA and xray done sent for eval, suggested US 

## 2023-12-22 NOTE — Discharge Instructions (Signed)
 Your labs here were reassuring today.  Your blood counts were normal and did not show any signs of infection.  Your kidney, liver, and pancreas labs were normal.  Your pregnancy test was negative.  Your urine did not show any signs of infection.  You have been prescribed Provera which should help stop your menstrual bleeding.  Please follow-up with your gynecologist as soon as possible for further management of your irregular cycles.  Return to the ER for any severe worsening of your abdominal pain, fevers, vomiting, any other new or concerning symptoms.

## 2023-12-22 NOTE — ED Provider Notes (Signed)
  EMERGENCY DEPARTMENT AT [facility] Provider Note   CSN: [id number] Arrival date & time: 12/22/23  1657     History  Chief Complaint  Patient presents with   Abdominal Pain    Susan Harmon is a 18 y.o. female with a significant past medical history presents with concern for left lower quadrant pain that started earlier today.  States pain is like a strain that occurs only with movement.  No pain at rest.  Denies any fevers, chills, nausea, vomiting, diarrhea.  Denies any abnormal vaginal discharge, dysuria, hematuria, increased frequency.  Reports onset of vaginal bleeding along with the pain.  She just got off of her menstrual cycle and is not supposed to be on her period.  States that she has had some irregular periods in the past and is being followed by her gynecologist for this. She is sexually active but denies concern for STIs.   Abdominal Pain      Home Medications Prior to Admission medications   Medication Sig Start Date End Date Taking? Authorizing Provider  medroxyPROGESTERone (PROVERA) 5 MG tablet Take 2 tablets (10 mg total) by mouth 3 (three) times daily for 5 days. 12/22/23 12/27/23 Yes Rexie Catena, PA-C  albuterol  (PROVENTIL  HFA;VENTOLIN  HFA) 108 (90 BASE) MCG/ACT inhaler Inhale 1-2 puffs into the lungs every 6 (six) hours as needed for wheezing or shortness of breath.    [provider]  cetirizine (ZYRTEC) 10 MG tablet Take by mouth. 01/07/21   [provider]  dicyclomine  (BENTYL ) 20 MG tablet Take 1 tablet (20 mg total) by mouth 2 (two) times daily. 06/14/23   Clark, Meghan R, PA-C  fluticasone (FLONASE) 50 MCG/ACT nasal spray Place into both nostrils. 07/12/22   [provider]  ibuprofen  (ADVIL ,MOTRIN ) 400 MG tablet Take 1 tablet (400 mg total) by mouth every 6 (six) hours as needed. 11/13/17   Carleton Cheek, PA-C  ibuprofen  (ADVIL ,MOTRIN ) 600 MG tablet Take 1 tablet (600 mg total) by mouth every 6 (six) hours  as needed for fever, headache or mild pain. 01/28/18   Jannine Meo, NP  ipratropium (ATROVENT ) 0.02 % nebulizer solution Take 2.5 mLs (0.5 mg total) by nebulization every 6 (six) hours as needed for wheezing or shortness of breath. 07/07/22   Geiple, Joshua, PA-C  methocarbamol  (ROBAXIN ) 500 MG tablet Take 1 tablet (500 mg total) by mouth 2 (two) times daily. 10/10/23   Felicie Horning, PA-C  montelukast (SINGULAIR) 10 MG tablet Take 1 tablet by mouth daily. 05/17/21   [provider]  Pantoprazole Sodium (PROTONIX PO) Take by mouth.    [provider]  predniSONE  (DELTASONE ) 20 MG tablet Take 2 tablets (40 mg total) by mouth daily. 12/09/23   Almond Army, MD  SYMBICORT 160-4.5 MCG/ACT inhaler Inhale 2 puffs into the lungs 2 (two) times daily. 07/19/22   [provider]      Allergies    Patient has no known allergies.    Review of Systems   Review of Systems  Gastrointestinal:  Positive for abdominal pain.    Physical Exam Updated Vital Signs BP 111/75   Pulse 71   Temp 98.5 F (36.9 C) (Oral)   Resp 16   LMP 12/07/2023   SpO2 96%  Physical Exam Vitals and nursing note reviewed. Exam conducted with a chaperone present.  Constitutional:      General: She is not in acute distress.    Appearance: She is well-developed.  HENT:  Head: Normocephalic and atraumatic.  Eyes:     Conjunctiva/sclera: Conjunctivae normal.  Cardiovascular:     Rate and Rhythm: Normal rate and regular rhythm.     Heart sounds: No murmur heard. Pulmonary:     Effort: Pulmonary effort is normal. No respiratory distress.     Breath sounds: Normal breath sounds.  Abdominal:     Palpations: Abdomen is soft.     Tenderness: There is no abdominal tenderness.     Comments: No rebound or gaurding  Genitourinary:    Comments: RN Harlee Lichtenstein present to chaperone exam  Vaginal vault with moderate amount of blood. No clots noted. Cervix closed. No cervical motion  tenderness Musculoskeletal:        General: No swelling.     Cervical back: Neck supple.  Skin:    General: Skin is warm and dry.     Capillary Refill: Capillary refill takes less than 2 seconds.  Neurological:     Mental Status: She is alert.  Psychiatric:        Mood and Affect: Mood normal.     ED Results / Procedures / Treatments   Labs (all labs ordered are listed, but only abnormal results are displayed) Labs Reviewed  COMPREHENSIVE METABOLIC PANEL WITH GFR - Abnormal; Notable for the following components:      Result Value   Glucose, Bld 113 (*)    All other components within normal limits  CBC - Abnormal; Notable for the following components:   MCV 77.5 (*)    MCH 25.1 (*)    All other components within normal limits  URINALYSIS, ROUTINE W REFLEX MICROSCOPIC - Abnormal; Notable for the following components:   Color, Urine COLORLESS (*)    Hgb urine dipstick LARGE (*)    Leukocytes,Ua TRACE (*)    Bacteria, UA RARE (*)    All other components within normal limits  WET PREP, GENITAL  LIPASE, BLOOD  PREGNANCY, URINE  GC/CHLAMYDIA PROBE AMP ([place]) NOT AT [facility]    EKG None  Radiology No results found.  Procedures Procedures    Medications Ordered in ED Medications - No data to display  ED Course/ Medical Decision Making/ A&P                                 Medical Decision Making Amount and/or Complexity of Data Reviewed Labs: ordered.     Differential diagnosis includes but is not limited to Cholelithiasis, cholangitis, choledocholithiasis, peptic ulcer, gastritis, gastroenteritis, appendicitis, IBS, IBD, DKA, nephrolithiasis, UTI, pyelonephritis, pancreatitis, diverticulitis, mesenteric ischemia, abdominal aortic aneurysm, small bowel obstruction, volvulus, ovarian torsion and pregnancy related concerns in females of childbearing age    ED Course:  Upon initial evaluation, patient is well-appearing, stable vital signs.  Abdomen is soft and  nontender upon my evaluation.  Pelvic exam performed with RN chaperone present.  Moderate amount of blood in the vaginal vault without any clots.  Cervix closed.  Labs Ordered: I Ordered, and personally interpreted labs.  The pertinent results include:   CBC without leukocytosis CMP unremarkable, no elevation in LFTs or creatinine.  Electrolytes within normal limits aside from mildly elevated glucose at 113 Lipase within normal limits Wet prep negative for yeast, trichomoniasis, BV Urinalysis without signs of infection Pregnancy negative  Upon re-evaluation, patient well-appearing, still with stable vitals.  Given abdomen soft nontender, stable vitals, labs unremarkable, low concern for acute intra-abdominal pathology.  Consider diverticulitis  given left lower quadrant pain, but she is not having any diarrhea or bloody stools.  Considered torsion, but patient states pain is not present at rest, very comfortable appearing.  Feel her pain is most likely related to uterine cramping given it started with her vaginal bleeding.  Stable and appropriate for discharge home    Impression: Abnormal uterine bleeding  Disposition:  The patient was discharged home with instructions to take oral Provera to help with her heavy menstrual bleeding/abnormal uterine bleeding.  Follow-up with her gynecologist soon as possible for further management of her abnormal uterine bleeding.  She asked if we had evaluated for possible ovarian cyst, discussed that we did not have ultrasound here today to perform this.  Recommended she follow-up with her gynecologist for this ultrasound outpatient. Return precautions given.   This chart was dictated using voice recognition software, Dragon. Despite the best efforts of this provider to proofread and correct errors, errors may still occur which can change documentation meaning.          Final Clinical Impression(s) / ED Diagnoses Final diagnoses:  Abnormal uterine  bleeding (AUB)    Rx / DC Orders ED Discharge Orders          Ordered    medroxyPROGESTERone (PROVERA) 5 MG tablet  3 times daily        12/22/23 1956              Rexie Catena, PA-C 12/22/23 1957    Afton Horse T, DO 12/24/23 1542

## 2023-12-25 LAB — GC/CHLAMYDIA PROBE AMP (~~LOC~~) NOT AT ARMC
Chlamydia: NEGATIVE
Comment: NEGATIVE
Comment: NORMAL
Neisseria Gonorrhea: NEGATIVE

## 2024-06-19 NOTE — Progress Notes (Signed)
 Subjective:    Susan Harmon is a 18 y.o. (DOB September 08, 2005) female.     Patient presents with  . fatigue    Is concerned that vitamin D is low again. Pt said she is having trouble functioning. Feels like she got hit by a bus.      HPI  Patient is a 18 yo female who presents to office for evaluation of excessive fatigue.   Patient has history of Vitamin D deficiency. She took the Vitamin D supplement x 12 weeks and has not followed up since. She states she feels extremely sleepy, despite sleeping through the night. She has missed 2 classes this week due to her fatigue. Denies hair thinning, constipation, shortness of breath, and recent change in medications.   Also requesting STD testing today. No new sexual partners or any sxs.    Reviewed and updated this visit by provider: Tobacco  Allergies  Meds  Problems  Med Hx  Surg Hx  Fam Hx       Review of Systems  Constitutional:  Positive for fatigue.  HENT: Negative.    Eyes: Negative.   Respiratory: Negative.    Cardiovascular: Negative.   Gastrointestinal: Negative.   Genitourinary: Negative.   Neurological: Negative.   All other systems reviewed and are negative.     Objective:   Vitals:   06/19/24 0934  BP: 102/78  Patient Position: Sitting  Pulse: 86  Temp: 97.3 F (36.3 C)  TempSrc: Skin  Resp: 18  Height: 5' 4 (1.626 m)  Weight: 194 lb (88 kg)  SpO2: 98%  BMI (Calculated): 33.3  PainSc: 0-No pain   Physical Exam Vitals and nursing note reviewed.  Constitutional:      Appearance: Normal appearance.  HENT:     Head: Normocephalic and atraumatic.     Right Ear: Tympanic membrane, ear canal and external ear normal.     Left Ear: Tympanic membrane, ear canal and external ear normal. There is impacted cerumen.     Nose: Nose normal.     Mouth/Throat:     Mouth: Mucous membranes are moist.     Pharynx: Oropharynx is clear.  Eyes:     Extraocular Movements: Extraocular movements  intact.     Conjunctiva/sclera: Conjunctivae normal.     Pupils: Pupils are equal, round, and reactive to light.  Cardiovascular:     Rate and Rhythm: Normal rate and regular rhythm.     Pulses: Normal pulses.     Heart sounds: Normal heart sounds.  Musculoskeletal:        General: Normal range of motion.     Cervical back: Normal range of motion and neck supple.  Pulmonary:     Effort: Pulmonary effort is normal.     Breath sounds: Normal breath sounds.  Abdominal:     General: Abdomen is flat. Bowel sounds are normal.     Palpations: Abdomen is soft.  Lymphadenopathy:     Cervical: No cervical adenopathy.  Skin:    General: Skin is warm and dry.  Neurological:     General: No focal deficit present.     Mental Status: She is alert and oriented to person, place, and time.  Psychiatric:        Mood and Affect: Mood normal.        Behavior: Behavior normal.        Thought Content: Thought content normal.        Judgment: Judgment normal.   Procedure: L  ear cerumen removal Instruments/Methods used:  Irrigation and curette  Description of Wax: Dark, soft Patient tolerated procedure well.  Ear canal was clear and TM was visualized and normal.      Assessment / Plan:   Assessment 1. Fatigue, unspecified type (Primary) -     Anemia Profile B; Future -     Vitamin B12 -     TSH -     Comprehensive Metabolic Panel -     CBC And Differential 2. Vitamin D deficiency -     Vitamin D 25 Hydroxy; Future 3. Screening for STDs (sexually transmitted diseases) -     CT, GC & TV, NAA Vag Swab Urine Urine; Future 4. Impacted cerumen of left ear    Plan 1-2. Excessive fatigue History of Vitamin D deficiency  History of low iron saturation and low-range of normal iron  Labs today: see orders.   3. Request for routine STI screening No new partners Labs today: see orders.   4. Impacted cerumen in left ear. Denies ear pain and muffled hearing. L TM visualized. TM intact and  bony landmarks appreciated.  Previous office visits reviewed.  Previous labs reviewed.  Risks, benefits, and alternatives of the medications and treatment plan prescribed today were discussed, and patient expressed understanding. Plan follow-up as discussed or as needed if any worsening symptoms or change in condition.

## 2024-06-28 NOTE — Telephone Encounter (Signed)
 Pharmacy calls to report provider doesn't come up on covered by Medicaid.  It must be a facility problem bc there have been no other reports.  This RN gives adult supervising provider Dr Parks NPI and DEA number to try to run the Medrol dose pack covered by ins.  Nothing further needed from [facility] care team.

## 2024-06-28 NOTE — Progress Notes (Signed)
 Subjective Patient ID: Susan Harmon is a 18 y.o. female.  Chief Complaint  Patient presents with  . Cough    Started x2 days ago, states that this morning and last night her throat started hurting. No meds on board.  Also complaining of upper back pain.   . Nasal Congestion  . Asthma    The following information was reviewed by members of the visit team:  Tobacco  Allergies  Meds  Problems  Med Hx  Surg Hx  Fam Hx  Soc  Hx     18 year old female with past medical history significant for asthma presents with complaints of nasal congestion, runny nose, sore throat, cough x 2 days.  Is also experiencing right upper back pain with breathing. Denies injury or trauma.  Denies fevers, headaches, body aches, difficulty swallowing, abdominal pain, nausea, vomiting, diarrhea.  Denies chest pain, shortness of breath, palpitations.  Has not had to use albuterol  inhaler.  No OTC medications for symptoms.  Completed doxycycline treatment for chlamydia today.  Cough Associated symptoms include rhinorrhea and a sore throat. Pertinent negatives include no chest pain, fever, headaches, myalgias, rash or shortness of breath. Her past medical history is significant for asthma.  Asthma She complains of cough. There is no shortness of breath. Associated symptoms include rhinorrhea and a sore throat. Pertinent negatives include no appetite change, chest pain, fever, headaches, myalgias or trouble swallowing. Her past medical history is significant for asthma.    Review of Systems  Constitutional:  Negative for activity change, appetite change, fatigue and fever.  HENT:  Positive for congestion, rhinorrhea and sore throat. Negative for trouble swallowing.   Eyes:  Negative for visual disturbance.  Respiratory:  Positive for cough. Negative for chest tightness and shortness of breath.   Cardiovascular:  Negative for chest pain and palpitations.  Gastrointestinal:  Negative for abdominal  pain, diarrhea, nausea and vomiting.  Genitourinary:  Negative for decreased urine volume, difficulty urinating, dysuria, flank pain and frequency.  Musculoskeletal:  Positive for back pain. Negative for myalgias and neck pain.  Skin:  Negative for color change and rash.  Neurological:  Negative for dizziness, weakness, light-headedness and headaches.  Psychiatric/Behavioral:  Negative for behavioral problems.     Objective Physical Exam Vitals and nursing note reviewed.  Constitutional:      Appearance: Normal appearance.  HENT:     Head: Normocephalic and atraumatic.     Nose: Congestion present. No rhinorrhea.     Mouth/Throat:     Mouth: Mucous membranes are moist. No oral lesions.     Pharynx: Oropharynx is clear. Uvula midline. No pharyngeal swelling, oropharyngeal exudate, posterior oropharyngeal erythema or uvula swelling.     Tonsils: No tonsillar exudate or tonsillar abscesses. 2+ on the right. 2+ on the left.     Comments: No trismus or drooling. Eyes:     Extraocular Movements: Extraocular movements intact.     Conjunctiva/sclera: Conjunctivae normal.     Pupils: Pupils are equal, round, and reactive to light.  Cardiovascular:     Rate and Rhythm: Normal rate and regular rhythm.     Pulses: Normal pulses.          Radial pulses are 2+ on the right side and 2+ on the left side.     Heart sounds: Normal heart sounds.  Pulmonary:     Effort: Pulmonary effort is normal.     Breath sounds: No decreased breath sounds, wheezing or rhonchi.  Abdominal:  General: There is no distension.     Palpations: Abdomen is soft.     Tenderness: There is no abdominal tenderness. There is no right CVA tenderness, left CVA tenderness, guarding or rebound.  Musculoskeletal:        General: Normal range of motion.     Cervical back: Normal, normal range of motion and neck supple. No rigidity or tenderness.     Thoracic back: Normal. No tenderness.     Lumbar back: Normal. No  tenderness.  Lymphadenopathy:     Cervical: No cervical adenopathy.  Skin:    General: Skin is warm and dry.  Neurological:     General: No focal deficit present.     Mental Status: She is alert and oriented to person, place, and time.  Psychiatric:        Behavior: Behavior is cooperative.     Assessment/Plan Diagnoses and all orders for this visit:  Viral URI with cough -     XR Chest 2 Views  Upper back pain on right side -     XR Chest 2 Views  Other orders -     doxycycline (VIBRA-TABS) 100 mg tablet; take with food, finished it today -     methylPREDNISolone (MEDROL DOSEPAK) 4 mg 6 day dose pack; Take As Directed On Package    Patient presents ambulatory, A&O, VSS, NAD, nontoxic appearing.  Speaking clear fluent sentences.  Breathing even, regular, labored respirations.  Physical exam as described.  Notable nasal congestion.  Posterior pharynx clear without erythema, swelling, exudate.  Uvula midline without swelling or deviation.  No PTA.  No cervical adenopathy.  Full range of motion of neck without pain or rigidity.  Low concern for RPA. No mastoiditis.  Lung sounds clear bilaterally without wheezing or rhonchi.  Regular RR, O2 sat 99% room air.  Due to history of asthma and right upper back pain we will obtain chest x-ray to evaluate for pneumonia.  Abdomen soft, nontender.  Appears clinically well-hydrated.  Declines viral swabs today. Chest x-ray independent read negative for pneumonia, pneumothorax.  Peribronchiolar thickening noted, consistent with virus / bronchitis. Final radiologist read pending.  Results discussed with patient.  Discussed symptoms appear most likely viral in nature at this time.  Will send in Medrol Dosepak for supportive care.  Supportive care discussed.  Strict return precautions discussed.  Recommend follow-up with PCP.  Patient agreeable without further questions or concerns.  1014: Final radiology read, negative for acute cardiac or pulmonary  abnormality. No change to treatment plan.     Electronically signed: Alan Dorothyann Gaudier, NP 06/28/2024  10:15 AM

## 2024-07-01 ENCOUNTER — Emergency Department (HOSPITAL_BASED_OUTPATIENT_CLINIC_OR_DEPARTMENT_OTHER)
Admission: EM | Admit: 2024-07-01 | Discharge: 2024-07-01 | Disposition: A | Discharge status: Home / Self Care | Attending: Emergency Medicine | Admitting: Emergency Medicine

## 2024-07-01 ENCOUNTER — Encounter (HOSPITAL_BASED_OUTPATIENT_CLINIC_OR_DEPARTMENT_OTHER): Payer: Self-pay | Admitting: Emergency Medicine

## 2024-07-01 ENCOUNTER — Emergency Department (HOSPITAL_BASED_OUTPATIENT_CLINIC_OR_DEPARTMENT_OTHER): Admitting: Radiology

## 2024-07-01 ENCOUNTER — Other Ambulatory Visit: Payer: Self-pay

## 2024-07-01 DIAGNOSIS — J45909 Unspecified asthma, uncomplicated: Secondary | ICD-10-CM | POA: Diagnosis not present

## 2024-07-01 DIAGNOSIS — Z7951 Long term (current) use of inhaled steroids: Secondary | ICD-10-CM | POA: Diagnosis not present

## 2024-07-01 DIAGNOSIS — J069 Acute upper respiratory infection, unspecified: Secondary | ICD-10-CM | POA: Insufficient documentation

## 2024-07-01 DIAGNOSIS — R059 Cough, unspecified: Secondary | ICD-10-CM | POA: Diagnosis present

## 2024-07-01 LAB — BASIC METABOLIC PANEL WITH GFR
Anion gap: 10 (ref 5–15)
BUN: 9 mg/dL (ref 6–20)
CO2: 25 mmol/L (ref 22–32)
Calcium: 9.8 mg/dL (ref 8.9–10.3)
Chloride: 104 mmol/L (ref 98–111)
Creatinine, Ser: 0.67 mg/dL (ref 0.44–1.00)
GFR, Estimated: >60 mL/min (ref >60)
Glucose, Bld: 90 mg/dL (ref 70–99)
Potassium: 4.5 mmol/L (ref 3.5–5.1)
Sodium: 138 mmol/L (ref 135–145)

## 2024-07-01 LAB — CBC WITH DIFFERENTIAL/PLATELET
Abs Immature Granulocytes: 0.01 K/uL (ref 0.00–0.07)
Basophils Absolute: 0 K/uL (ref 0.0–0.1)
Basophils Relative: 0 %
Eosinophils Absolute: 0 K/uL (ref 0.0–0.5)
Eosinophils Relative: 1 %
HCT: 37.6 % (ref 36.0–46.0)
Hemoglobin: 12 g/dL (ref 12.0–15.0)
Immature Granulocytes: 0 %
Lymphocytes Relative: 41 %
Lymphs Abs: 1.9 K/uL (ref 0.7–4.0)
MCH: 25 pg — ABNORMAL LOW (ref 26.0–34.0)
MCHC: 31.9 g/dL (ref 30.0–36.0)
MCV: 78.3 fL — ABNORMAL LOW (ref 80.0–100.0)
Monocytes Absolute: 0.4 K/uL (ref 0.1–1.0)
Monocytes Relative: 8 %
Neutro Abs: 2.4 K/uL (ref 1.7–7.7)
Neutrophils Relative %: 50 %
Platelets: 283 K/uL (ref 150–400)
RBC: 4.8 MIL/uL (ref 3.87–5.11)
RDW: 13.7 % (ref 11.5–15.5)
WBC: 4.7 K/uL (ref 4.0–10.5)
nRBC: 0 % (ref 0.0–0.2)

## 2024-07-01 LAB — RESP PANEL BY RT-PCR (RSV, FLU A&B, COVID)  RVPGX2
Influenza A by PCR: NEGATIVE
Influenza B by PCR: NEGATIVE
Resp Syncytial Virus by PCR: NEGATIVE
SARS Coronavirus 2 by RT PCR: NEGATIVE

## 2024-07-01 LAB — HCG, SERUM, QUALITATIVE: Preg, Serum: NEGATIVE

## 2024-07-01 MED ORDER — BENZONATATE 100 MG PO CAPS: 100.0000 mg | ORAL_CAPSULE | Freq: Three times a day (TID) | ORAL | 0 refills | Status: AC | PRN

## 2024-07-01 NOTE — ED Provider Notes (Signed)
 [place] EMERGENCY DEPARTMENT AT [facility] Provider Note   CSN: [id number] Arrival date & time: 07/01/24  1135     Patient presents with: Nasal Congestion   Susan Harmon is a 18 y.o. female with past medical history of asthma who presents emergency department for evaluation of flulike symptoms and concern for pneumonia.  Patient states that she has been sick since Thursday with cough, congestion, sore throat.  Patient has a history of multiple bouts of pneumonia and reported to the emergency department to rule out pneumonia.  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  She denies headache, fever, body aches.  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   HPI     Prior to Admission medications   Medication Sig Start Date End Date Taking? Authorizing Provider  benzonatate (TESSALON) 100 MG capsule Take 1 capsule (100 mg total) by mouth 3 (three) times daily as needed for cough. 07/01/24  Yes Sonika Levins, Marry RAMAN, PA-C  albuterol  (PROVENTIL  HFA;VENTOLIN  HFA) 108 (90 BASE) MCG/ACT inhaler Inhale 1-2 puffs into the lungs every 6 (six) hours as needed for wheezing or shortness of breath.    [provider]  cetirizine (ZYRTEC) 10 MG tablet Take by mouth. 01/07/21   [provider]  dicyclomine  (BENTYL ) 20 MG tablet Take 1 tablet (20 mg total) by mouth 2 (two) times daily. 06/14/23   Clark, Meghan R, PA-C  fluticasone (FLONASE) 50 MCG/ACT nasal spray Place into both nostrils. 07/12/22   [provider]  ibuprofen  (ADVIL ,MOTRIN ) 400 MG tablet Take 1 tablet (400 mg total) by mouth every 6 (six) hours as needed. 11/13/17   Keith Sor, PA-C  ibuprofen  (ADVIL ,MOTRIN ) 600 MG tablet Take 1 tablet (600 mg total) by mouth every 6 (six) hours as needed for fever, headache or mild pain. 01/28/18   Everlean Laymon SAILOR, NP  ipratropium (ATROVENT ) 0.02 % nebulizer solution Take 2.5 mLs  (0.5 mg total) by nebulization every 6 (six) hours as needed for wheezing or shortness of breath. 07/07/22   Geiple, Joshua, PA-C  medroxyPROGESTERone  (PROVERA ) 5 MG tablet Take 2 tablets (10 mg total) by mouth 3 (three) times daily for 5 days. 12/22/23 12/27/23  Veta Palma, PA-C  methocarbamol  (ROBAXIN ) 500 MG tablet Take 1 tablet (500 mg total) by mouth 2 (two) times daily. 10/10/23   Donnajean Lynwood DEL, PA-C  montelukast (SINGULAIR) 10 MG tablet Take 1 tablet by mouth daily. 05/17/21   [provider]  Pantoprazole Sodium (PROTONIX PO) Take by mouth.    [provider]  predniSONE  (DELTASONE ) 20 MG tablet Take 2 tablets (40 mg total) by mouth daily. 12/09/23   Doretha Folks, MD  SYMBICORT 160-4.5 MCG/ACT inhaler Inhale 2 puffs into the lungs 2 (two) times daily. 07/19/22   [provider]    Allergies: Patient has no known allergies.    Review of Systems  Respiratory:  Positive for cough and chest tightness.     Updated Vital Signs BP 108/73 (BP Location: Right Arm)   Pulse 92   Temp 98.7 F (37.1 C)   Resp 16   Ht 5' 3 (1.6 m)   Wt 88.5 kg   SpO2 99%   BMI 34.54 kg/m   Physical Exam Vitals and nursing note reviewed.  Constitutional:      Appearance: Normal appearance.  HENT:     Head: Normocephalic and atraumatic.     Mouth/Throat:     Mouth: Mucous  membranes are moist.  Eyes:     General: No scleral icterus.       Right eye: No discharge.        Left eye: No discharge.     Conjunctiva/sclera: Conjunctivae normal.  Cardiovascular:     Rate and Rhythm: Normal rate and regular rhythm.     Pulses: Normal pulses.  Pulmonary:     Effort: Pulmonary effort is normal.     Breath sounds: Normal breath sounds.     Comments: Lung sounds are clear bilaterally Abdominal:     General: There is no distension.     Tenderness: There is no abdominal tenderness.  Musculoskeletal:        General: No deformity.     Cervical back: Normal range of  motion.  Skin:    General: Skin is warm and dry.     Capillary Refill: Capillary refill takes less than 2 seconds.  Neurological:     Mental Status: She is alert.     Motor: No weakness.  Psychiatric:        Mood and Affect: Mood normal.     (all labs ordered are listed, but only abnormal results are displayed) Labs Reviewed  CBC WITH DIFFERENTIAL/PLATELET - Abnormal; Notable for the following components:      Result Value   MCV 78.3 (*)    MCH 25.0 (*)    All other components within normal limits  RESP PANEL BY RT-PCR (RSV, FLU A&B, COVID)  RVPGX2  BASIC METABOLIC PANEL WITH GFR  HCG, SERUM, QUALITATIVE    EKG: None  Radiology: DG Chest 2 View Result Date: 07/01/2024 CLINICAL DATA:  Shortness of breath, congestion and cough. EXAM: CHEST - 2 VIEW COMPARISON:  12/09/2023 and CT chest 12/08/2021. FINDINGS: Trachea is midline. Heart size stable. Air-filled upper esophagus with history of tracheoesophageal repair. Lungs are clear. No pleural fluid. Close approximation of the right fifth and sixth posterior ribs, likely due to TE fistula repair. IMPRESSION: No acute findings. Electronically Signed   By: Newell Eke M.D.   On: 07/01/2024 13:59    Procedures   Medications Ordered in the ED - No data to display                               Medical Decision Making Amount and/or Complexity of Data Reviewed Labs: ordered. Radiology: ordered.  Risk Prescription drug management.   This patient presents to the ED for concern of URI symptoms, this involves an extensive number of treatment options, and is a complaint that carries with it a high risk of complications and morbidity.   Differential diagnosis includes: Flu, COVID, RSV, URI, pneumonia  Co morbidities:  history of asthma   Additional history:  Patient was evaluated by urgent care on 11/14 for the same complaint and her workup was unremarkable at that time.  She was given a prescription for a Medrol Dosepak for  supportive care.  Lab Tests:  I Ordered, and personally interpreted labs.  The pertinent results include: No acute abnormalities  Imaging Studies:  I ordered imaging studies including chest x-ray I independently visualized and interpreted imaging which showed no acute cardio pulmonary processes I agree with the radiologist interpretation  Cardiac Monitoring/ECG:  The patient was maintained on a cardiac monitor.  I personally viewed and interpreted the cardiac monitored which showed an underlying rhythm of: Normal sinus  Medicines ordered and prescription drug management: No medication indicated  at this time  Test Considered:   none  Critical Interventions:   none  Consultations Obtained: None  Problem List / ED Course:     ICD-10-CM   1. Viral upper respiratory tract infection  J06.9       MDM: 18 year old female who presents emergency department for evaluation of URI symptoms and concern for pneumonia.  Patient has been experiencing cough, congestion, sore throat since Thursday.  Her respiratory panel was negative for flu, COVID, RSV.  Her lung sounds were clear bilaterally.  I ordered a chest x-ray to rule out possible pneumonia.  Chest x-ray shows no acute cardiopulmonary abnormalities.  Basic labs were obtained and are unremarkable.  I suspect patient's symptoms are likely secondary to unspecified upper respiratory infection.  I have educated patient on continuing the supportive measures she has been doing at home.  I have also given patient a prescription for Tessalon Perles for cough.  Patient verbalized her understanding to these measures.  I have informed patient that she should follow-up with her PCP in the next few days if her symptoms do not improve or worsen.  Her vital signs are stable.  Patient is appropriate for discharge at this time.   Dispostion:  After consideration of the diagnostic results and the patients response to treatment, I feel that the patient  would benefit from continued supportive care.    Final diagnoses:  Viral upper respiratory tract infection    ED Discharge Orders          Ordered    benzonatate (TESSALON) 100 MG capsule  3 times daily PRN        07/01/24 1417               Torrence Marry RAMAN, PA-C 07/01/24 1421    Ruthe Cornet, DO 07/01/24 1443

## 2024-07-01 NOTE — Discharge Instructions (Signed)
 It was a pleasure taking care of you today. You were seen in the Emergency Department for upper respiratory symptoms and concern for pneumonia. Your work-up was reassuring. Your chest x-ray shows no acute abnormality and no evidence of pneumonia.  Your respiratory panel was negative for the flu, COVID, RSV.  I suspect your symptoms are likely an unspecified upper respiratory infection.  As discussed, you can continue the supportive care measures that you have been doing at home.  I am also sending a prescription for Tessalon Perles to take every 8 hours as needed for cough. Refer to the attached documentation for further management of your symptoms. Follow up with your PCP if your symptoms worsen  Please return to the ER if you experience chest pain, trouble breathing, intractable nausea/vomiting or any other life threatening illnesses.

## 2024-07-01 NOTE — ED Triage Notes (Signed)
 Pt caox4 ambulatory c/o congestion and productive cough since Thursday, has been having to use albuterol  inhaler and minimal relief with OTC meds.

## 2024-09-18 ENCOUNTER — Encounter (HOSPITAL_BASED_OUTPATIENT_CLINIC_OR_DEPARTMENT_OTHER): Payer: Self-pay | Admitting: *Deleted

## 2024-09-18 ENCOUNTER — Other Ambulatory Visit: Payer: Self-pay

## 2024-09-18 ENCOUNTER — Emergency Department (HOSPITAL_BASED_OUTPATIENT_CLINIC_OR_DEPARTMENT_OTHER)
Admission: EM | Admit: 2024-09-18 | Discharge: 2024-09-18 | Disposition: A | Discharge status: Home / Self Care | Attending: Emergency Medicine | Admitting: Emergency Medicine

## 2024-09-18 ENCOUNTER — Emergency Department (HOSPITAL_BASED_OUTPATIENT_CLINIC_OR_DEPARTMENT_OTHER): Admitting: Radiology

## 2024-09-18 DIAGNOSIS — Z7952 Long term (current) use of systemic steroids: Secondary | ICD-10-CM | POA: Insufficient documentation

## 2024-09-18 DIAGNOSIS — Z7951 Long term (current) use of inhaled steroids: Secondary | ICD-10-CM | POA: Insufficient documentation

## 2024-09-18 DIAGNOSIS — J069 Acute upper respiratory infection, unspecified: Secondary | ICD-10-CM | POA: Insufficient documentation

## 2024-09-18 DIAGNOSIS — J45909 Unspecified asthma, uncomplicated: Secondary | ICD-10-CM | POA: Insufficient documentation

## 2024-09-18 LAB — RESP PANEL BY RT-PCR (RSV, FLU A&B, COVID)  RVPGX2
Influenza A by PCR: NEGATIVE
Influenza B by PCR: NEGATIVE
Resp Syncytial Virus by PCR: NEGATIVE
SARS Coronavirus 2 by RT PCR: NEGATIVE

## 2024-09-18 MED ORDER — IPRATROPIUM-ALBUTEROL 0.5-2.5 (3) MG/3ML IN SOLN
3.0000 mL | Freq: Once | RESPIRATORY_TRACT | Status: AC
  Administered 2024-09-18: 3 mL via RESPIRATORY_TRACT
  Filled 2024-09-18: qty 3

## 2024-09-18 MED ORDER — IPRATROPIUM BROMIDE 0.02 % IN SOLN: 0.5000 mg | Freq: Four times a day (QID) | RESPIRATORY_TRACT | 0 refills | Status: AC | PRN

## 2024-09-18 NOTE — ED Provider Notes (Signed)
 " [place] EMERGENCY DEPARTMENT AT [facility] Provider Note   CSN: [id number] Arrival date & time: 09/18/24  9246     Patient presents with: Nasal Congestion   Susan Harmon is a 19 y.o. female.   HPI   19 year old female with medical history significant for esophageal atresia status post repair, GERD, asthma presenting to the emergency department with URI symptoms.  The patient has had cough and nasal congestion since Sunday.  She has had nasal drainage in the back of her throat, denies any overt sore throat accept when she wakes up after a night of nasal drainage.  She has had some pleuritic chest discomfort located in her left back and left side of her chest, worse with coughing and deep breathing.  No fevers or chills, she is tolerating p.o. intake.  No recent long travel, no history of DVT or PE, no hemoptysis, no lower extremity swelling or cramping.  She is not on estrogen-containing birth control pills.  Prior to Admission medications  Medication Sig Start Date End Date Taking? Authorizing Provider  Vitamin D, Ergocalciferol, (DRISDOL) 1.25 MG (50000 UNIT) CAPS capsule Take 50,000 Units by mouth every 7 (seven) days. 06/20/24  Yes [provider]  VYVANSE 10 MG capsule Take 10 mg by mouth daily. 09/10/24  Yes [provider]  albuterol  (PROVENTIL  HFA;VENTOLIN  HFA) 108 (90 BASE) MCG/ACT inhaler Inhale 1-2 puffs into the lungs every 6 (six) hours as needed for wheezing or shortness of breath.    [provider]  benzonatate  (TESSALON ) 100 MG capsule Take 1 capsule (100 mg total) by mouth 3 (three) times daily as needed for cough. 07/01/24   Dufour, Marry RAMAN, PA-C  cetirizine (ZYRTEC) 10 MG tablet Take by mouth. 01/07/21   [provider]  clindamycin (CLEOCIN T) 1 % lotion Apply 1 Application topically 2 (two) times daily. SMARTSIG:sparingly Topical 1-2 Times Daily    [provider]  dicyclomine  (BENTYL ) 20 MG tablet Take 1  tablet (20 mg total) by mouth 2 (two) times daily. 06/14/23   Clark, Meghan R, PA-C  fluticasone (FLONASE) 50 MCG/ACT nasal spray Place into both nostrils. 07/12/22   [provider]  ibuprofen  (ADVIL ,MOTRIN ) 400 MG tablet Take 1 tablet (400 mg total) by mouth every 6 (six) hours as needed. 11/13/17   Keith Sor, PA-C  ibuprofen  (ADVIL ,MOTRIN ) 600 MG tablet Take 1 tablet (600 mg total) by mouth every 6 (six) hours as needed for fever, headache or mild pain. 01/28/18   Everlean Laymon SAILOR, NP  ipratropium (ATROVENT ) 0.02 % nebulizer solution Take 2.5 mLs (0.5 mg total) by nebulization every 6 (six) hours as needed for wheezing or shortness of breath. 07/07/22   Geiple, Joshua, PA-C  medroxyPROGESTERone  (PROVERA ) 5 MG tablet Take 2 tablets (10 mg total) by mouth 3 (three) times daily for 5 days. 12/22/23 12/27/23  Veta Palma, PA-C  methocarbamol  (ROBAXIN ) 500 MG tablet Take 1 tablet (500 mg total) by mouth 2 (two) times daily. 10/10/23   Donnajean Lynwood DEL, PA-C  montelukast (SINGULAIR) 10 MG tablet Take 1 tablet by mouth daily. 05/17/21   [provider]  Pantoprazole Sodium (PROTONIX PO) Take by mouth.    [provider]  predniSONE  (DELTASONE ) 20 MG tablet Take 2 tablets (40 mg total) by mouth daily. 12/09/23   Doretha Folks, MD  SYMBICORT 160-4.5 MCG/ACT inhaler Inhale 2 puffs into the lungs 2 (two) times daily. 07/19/22   [provider]    Allergies: Patient has no known  allergies.    Review of Systems  HENT:  Positive for congestion.   Respiratory:  Positive for cough.   All other systems reviewed and are negative.   Updated Vital Signs BP 106/80   Pulse 84   Temp 98.2 F (36.8 C) (Oral)   Resp 16   LMP 09/15/2024 (Exact Date)   SpO2 97%   Physical Exam Vitals and nursing note reviewed.  Constitutional:      General: She is not in acute distress.    Appearance: She is well-developed.  HENT:     Head: Normocephalic and atraumatic.   Eyes:     Conjunctiva/sclera: Conjunctivae normal.  Cardiovascular:     Rate and Rhythm: Normal rate and regular rhythm.     Heart sounds: No murmur heard. Pulmonary:     Effort: Pulmonary effort is normal. No respiratory distress.     Breath sounds: Wheezing present.  Abdominal:     Palpations: Abdomen is soft.     Tenderness: There is no abdominal tenderness.  Musculoskeletal:        General: No swelling.     Cervical back: Neck supple.  Skin:    General: Skin is warm and dry.     Capillary Refill: Capillary refill takes less than 2 seconds.  Neurological:     Mental Status: She is alert.  Psychiatric:        Mood and Affect: Mood normal.     (all labs ordered are listed, but only abnormal results are displayed) Labs Reviewed  RESP PANEL BY RT-PCR (RSV, FLU A&B, COVID)  RVPGX2  PREGNANCY, URINE    EKG: EKG Interpretation Date/Time:  Wednesday September 18 2024 08:27:59 EST Ventricular Rate:  83 PR Interval:  152 QRS Duration:  90 QT Interval:  377 QTC Calculation: 443 R Axis:   88  Text Interpretation: Sinus rhythm Confirmed by Jerrol Agent ([id number]) on 09/18/2024 8:49:40 AM  Radiology: [facility] Chest 2 View Result Date: 09/18/2024 CLINICAL DATA:  Productive cough EXAM: CHEST - 2 VIEW COMPARISON:  July 01, 2024 FINDINGS: The heart size and mediastinal contours are within normal limits. Both lungs are clear. The visualized skeletal structures are unremarkable. IMPRESSION: No active cardiopulmonary disease. Electronically Signed   By: Agent Landy Raddle M.D.   On: 09/18/2024 08:52     Procedures   Medications Ordered in the ED  ipratropium-albuterol  (DUONEB) 0.5-2.5 (3) MG/3ML nebulizer solution 3 mL (3 mLs Nebulization Given 09/18/24 0818)                                    Medical Decision Making Amount and/or Complexity of Data Reviewed Labs: ordered. Radiology: ordered.  Risk Prescription drug management.    19 year old female with medical history  significant for esophageal atresia status post repair, GERD, asthma presenting to the emergency department with URI symptoms.  The patient has had cough and nasal congestion since Sunday.  She has had nasal drainage in the back of her throat, denies any overt sore throat accept when she wakes up after a night of nasal drainage.  She has had some pleuritic chest discomfort located in her left back and left side of her chest, worse with coughing and deep breathing.  No fevers or chills, she is tolerating p.o. intake.  No recent long travel, no history of DVT or PE, no hemoptysis, no lower extremity swelling or cramping.  She is not on estrogen-containing birth  control pills.  Susan Harmon is a 19 y.o. female who presents to the ED with a 3 day history of cough, rhinorrhea, and nasal congestion.  On arrival, the patient was vitally stable. On my exam, the patient is well-appearing and well-hydrated.  The patient's lungs are clear to auscultation bilaterally. Additionally, the patient has a soft/non-tender abdomen, and no oropharyngeal exudates.  There are no signs of meningismus.  I see no signs of an acute bacterial infection.  The patient's presentation is most consistent with a viral upper respiratory infection.  I have a low suspicion for pneumonia as the patient's cough has been non-productive and the patient is neither tachypneic nor hypoxic on room air.  Additionally, the patient is CTAB with only mild wheezing present.  In the setting of chest discomfort and cough, patient is notably PERC negative, low concern for PE.  Symptoms much more consistent with viral infectious etiology.  Given chest discomfort, will obtain EKG, chest x-ray, PCR testing for COVID flu and RSV.  A DuoNeb was ordered given the patient's mild wheezing and asthma history.  EKG: Sinus rhythm, ventricular rate 83, no abnormal intervals or acute ischemic changes  CXR: No acute cardiopulmonary abnormality.  Labs: COVID, flu,  RSV PCR testing was negative.  Symptoms most consistent with an upper respiratory tract infection.  I encouraged continued outpatient inhaler use, Tylenol  Motrin  as needed for symptomatic management in addition to continued oral hydration.  The patient/family felt safe being discharged from the ED.  They agreed to followup with the PCP if needed.  I provided ED return precautions.      Final diagnoses:  Upper respiratory tract infection, unspecified type    ED Discharge Orders     None          Jerrol Agent, MD 09/18/24 9088  "

## 2024-09-18 NOTE — Discharge Instructions (Addendum)
 Your chest x-ray was clear with no evidence of pneumonia, your COVID flu and RSV PCR testing was negative.  You had mild wheezing on exam and were treated with a DuoNeb.  Symptoms are most consistent with an upper respiratory tract infection.  Recommend you continue to take inhalers in the outpatient setting and treat symptomatically with Tylenol  Motrin  as needed, oral rehydration.  Follow-up with your PCP, return to the emergency department for any severe worsening symptoms.

## 2024-09-18 NOTE — ED Triage Notes (Signed)
 Pt is here for congestion which began Sunday.  Pt has had a productive cough and reports soreness when coughing.
